# Patient Record
Sex: Male | Born: 1950 | Race: White | Hispanic: No | Marital: Married | Smoking: Never smoker
Health system: Southern US, Community
[De-identification: ages and names within clinical notes are randomized; demographics above are authoritative.]

## PROBLEM LIST (undated history)

## (undated) DIAGNOSIS — I10 Essential (primary) hypertension: Secondary | ICD-10-CM

## (undated) DIAGNOSIS — H04123 Dry eye syndrome of bilateral lacrimal glands: Secondary | ICD-10-CM

## (undated) DIAGNOSIS — G4730 Sleep apnea, unspecified: Secondary | ICD-10-CM

## (undated) DIAGNOSIS — G4733 Obstructive sleep apnea (adult) (pediatric): Secondary | ICD-10-CM

## (undated) DIAGNOSIS — K635 Polyp of colon: Secondary | ICD-10-CM

## (undated) DIAGNOSIS — H3320 Serous retinal detachment, unspecified eye: Secondary | ICD-10-CM

## (undated) DIAGNOSIS — K5790 Diverticulosis of intestine, part unspecified, without perforation or abscess without bleeding: Secondary | ICD-10-CM

## (undated) DIAGNOSIS — M5431 Sciatica, right side: Secondary | ICD-10-CM

## (undated) DIAGNOSIS — C439 Malignant melanoma of skin, unspecified: Secondary | ICD-10-CM

## (undated) DIAGNOSIS — M545 Low back pain, unspecified: Secondary | ICD-10-CM

## (undated) DIAGNOSIS — N529 Male erectile dysfunction, unspecified: Secondary | ICD-10-CM

## (undated) DIAGNOSIS — G8929 Other chronic pain: Secondary | ICD-10-CM

## (undated) DIAGNOSIS — R937 Abnormal findings on diagnostic imaging of other parts of musculoskeletal system: Secondary | ICD-10-CM

## (undated) HISTORY — DX: Diverticulosis of intestine, part unspecified, without perforation or abscess without bleeding: K57.90

## (undated) HISTORY — DX: Sleep apnea, unspecified: G47.30

## (undated) HISTORY — DX: Essential (primary) hypertension: I10

## (undated) HISTORY — DX: Polyp of colon: K63.5

## (undated) HISTORY — DX: Serous retinal detachment, unspecified eye: H33.20

## (undated) HISTORY — PX: GANGLION CYST EXCISION: SHX1691

## (undated) HISTORY — DX: Sciatica, right side: M54.31

## (undated) HISTORY — DX: Malignant melanoma of skin, unspecified: C43.9

## (undated) HISTORY — DX: Obstructive sleep apnea (adult) (pediatric): G47.33

## (undated) HISTORY — DX: Dry eye syndrome of bilateral lacrimal glands: H04.123

## (undated) MED ORDER — MEFLOQUINE 250 MG TAB
250 mg | ORAL_TABLET | ORAL | Status: AC
Start: ? — End: 2013-10-24

---
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
## 1993-11-29 HISTORY — PX: RETINAL TEAR REPAIR CRYOTHERAPY: SHX5304

## 2006-08-31 HISTORY — PX: RETINAL DETACHMENT REPAIR W/ SCLERAL BUCKLE LE: SHX2338

## 2012-04-01 ENCOUNTER — Encounter

## 2012-07-19 LAB — URINALYSIS W/ RFLX MICROSCOPIC
Bilirubin: NEGATIVE
Blood: NEGATIVE
Glucose: NEGATIVE
Ketone: NEGATIVE
Leukocyte Esterase: NEGATIVE
Nitrites: NEGATIVE
Protein: NEGATIVE
Specific Gravity: 1.005 (ref 1.005–1.030)
Urobilinogen: 0.2 mg/dL (ref 0.0–1.9)
pH (UA): 7 (ref 5.0–7.5)

## 2012-07-19 LAB — LIPID PANEL
Cholesterol, total: 179 mg/dL (ref 100–199)
HDL Cholesterol: 51 mg/dL (ref >39)
LDL, calculated: 110 mg/dL — ABNORMAL HIGH (ref 0–99)
Triglyceride: 91 mg/dL (ref 0–149)
VLDL, calculated: 18 mg/dL (ref 5–40)

## 2012-07-19 LAB — CBC WITH AUTOMATED DIFF
ABS. BASOPHILS: 0 10*3/uL (ref 0.0–0.2)
ABS. EOSINOPHILS: 0 10*3/uL (ref 0.0–0.4)
ABS. IMM. GRANS.: 0 10*3/uL (ref 0.0–0.1)
ABS. MONOCYTES: 0.5 10*3/uL (ref 0.1–0.9)
ABS. NEUTROPHILS: 6.7 10*3/uL (ref 1.4–7.0)
Abs Lymphocytes: 1.7 10*3/uL (ref 0.7–3.1)
BASOPHILS: 0 % (ref 0–3)
EOSINOPHILS: 0 % (ref 0–5)
HCT: 45.8 % (ref 37.5–51.0)
HGB: 15.8 g/dL (ref 12.6–17.7)
IMMATURE GRANULOCYTES: 0 % (ref 0–2)
Lymphocytes: 19 % (ref 14–46)
MCH: 28.7 pg (ref 26.6–33.0)
MCHC: 34.5 g/dL (ref 31.5–35.7)
MCV: 83 fL (ref 79–97)
MONOCYTES: 6 % (ref 4–12)
NEUTROPHILS: 75 % — ABNORMAL HIGH (ref 40–74)
PLATELET: 201 10*3/uL (ref 155–379)
RBC: 5.5 x10E6/uL (ref 4.14–5.80)
RDW: 13.2 % (ref 12.3–15.4)
WBC: 9 10*3/uL (ref 3.4–10.8)

## 2012-07-19 LAB — METABOLIC PANEL, BASIC
BUN/Creatinine ratio: 17 (ref 9–20)
BUN: 15 mg/dL (ref 6–24)
CO2: 27 mmol/L (ref 19–28)
Calcium: 9.4 mg/dL (ref 8.7–10.2)
Chloride: 102 mmol/L (ref 97–108)
Creatinine: 0.86 mg/dL (ref 0.76–1.27)
GFR est AA: 110 mL/min/{1.73_m2} (ref >59)
GFR est non-AA: 95 mL/min/{1.73_m2} (ref >59)
Glucose: 99 mg/dL (ref 65–99)
Potassium: 4.7 mmol/L (ref 3.5–5.2)
Sodium: 141 mmol/L (ref 134–144)

## 2012-07-19 LAB — PSA, DIAGNOSTIC (PROSTATE SPECIFIC AG): Prostate Specific Ag: 1.6 ng/mL (ref 0.0–4.0)

## 2012-07-21 NOTE — Progress Notes (Signed)
Comprehensive Visit    New pt.  Patrick Duarte is a 61 y.o. male who presents for his comprehensive visit.  Pt is new to me.  He is a Education officer, environmental.  he does exercise.  No CP or SOB.  He thinks he is UTD tetanus vaccine.  BP's have been OK at home.        Past Medical History   Diagnosis Date   ??? HTN (hypertension)    ??? Environmental allergies      Past Surgical History   Procedure Laterality Date   ??? Hx colonoscopy  2004     Polyps, per pt.     Current Outpatient Prescriptions   Medication Sig Dispense Refill   ??? azelastine (ASTELIN) 137 mcg nasal spray 1 Spray two (2) times a day. Use in each nostril as directed       ??? fluticasone (FLONASE) 50 mcg/actuation nasal spray 2 Sprays by Both Nostrils route nightly.       ??? aspirin delayed-release 81 mg tablet Take  by mouth daily.         No current facility-administered medications for this visit.     No Known Allergies  History   Substance Use Topics   ??? Smoking status: Never Smoker    ??? Smokeless tobacco: Not on file   ??? Alcohol Use: No      Family History   Problem Relation Age of Onset   ??? Cancer Father      prostate ca   ??? Cancer Brother      prostate ca       Routine maintenance:          No health maintenance topics applied.       Objective:  BP 140/90   Ht 6' (1.829 m)   Wt 182 lb (82.555 kg)   BMI 24.68 kg/m2    Physical Exam  HEENT -- Pupils round. OP clear.  Neck -- Supple. No JVD. No LAD. No thyromegaly or nodules. No carotid bruits.  Heart -- RRR. No R/M/G.  Lungs -- CTA.  Abd -- Soft. NT/ND. No masses. BS present.  Extremities -- No LE edema b/l.  Prostate -- Nl size/tone.  No nodules.          Assessment/Plan:    1. Routine general medical examination at a health care facility  LIPID PANEL, METABOLIC PANEL, BASIC, CBC WITH AUTOMATED DIFF, URINALYSIS W/ RFLX MICROSCOPIC, PROSTATE SPECIFIC AG (PSA), REFERRAL TO GASTROENTEROLOGY for a screening colonoscopy.     Routine maintenance as above.    He will cont to keep an eye on his BP's at home & let me know if  they are elevated.      Author:  Nelida Gores, MD 07/21/2012 7:15 PM

## 2012-08-23 NOTE — Telephone Encounter (Signed)
Pt called    Pt is having the beginning of a sinus inf   The only symptoms that he has now is drainage and some sore throat     [facility] called in amox 500mg  TID times 10 days     Pt told not to take med unless symptoms get worse  #[phone]   11:38

## 2012-10-18 NOTE — Telephone Encounter (Signed)
Please call in refills, pharmacy will not refill   [phone]  Azelastine 137 mcg  Flonase 50 mcg  [facility] [phone]

## 2012-10-19 NOTE — Telephone Encounter (Signed)
10/18/12   Called in rxs to pharmacy times 6 mos   4:38

## 2012-12-16 NOTE — Progress Notes (Signed)
HPI:uri with full sinuses    Physical Examination:  BP 130/90   Ht 6' (1.829 m)   Wt 182 lb (82.555 kg)   BMI 24.68 kg/m2  General:  HEENT:neg  NECK:  Lungs:  Heart:  Breasts:  Abdomen:  Rectal:  Extremities:  Neurologic:    Prior to Admission medications    Medication Sig Start Date End Date Taking? Authorizing Provider   azelastine (ASTELIN) 137 mcg nasal spray 1 Spray two (2) times a day. Use in each nostril as directed   Yes Historical Provider   fluticasone (FLONASE) 50 mcg/actuation nasal spray 2 Sprays by Both Nostrils route nightly.   Yes Historical Provider   aspirin delayed-release 81 mg tablet Take  by mouth daily.   Yes Historical Provider     No Known Allergies    RESULTS:  Results for orders placed in visit on 07/18/12   LIPID PANEL       Result Value Range    Cholesterol, total 179  100 - 199 mg/dL    Triglyceride 91  0 - 149 mg/dL    HDL Cholesterol 51  >39 mg/dL    VLDL, calculated 18  5 - 40 mg/dL    LDL, calculated 161 (*) 0 - 99 mg/dL   METABOLIC PANEL, BASIC       Result Value Range    Glucose 99  65 - 99 mg/dL    BUN 15  6 - 24 mg/dL    Creatinine 0.96  0.45 - 1.27 mg/dL    GFR est non-AA 95  >40 mL/min/1.73    GFR est AA 110  >59 mL/min/1.73    BUN/Creatinine ratio 17  9 - 20    Sodium 141  134 - 144 mmol/L    Potassium 4.7  3.5 - 5.2 mmol/L    Chloride 102  97 - 108 mmol/L    CO2 27  19 - 28 mmol/L    Calcium 9.4  8.7 - 10.2 mg/dL   CBC WITH AUTOMATED DIFF       Result Value Range    WBC 9.0  3.4 - 10.8 x10E3/uL    RBC 5.50  4.14 - 5.80 x10E6/uL    HGB 15.8  12.6 - 17.7 g/dL    HCT 98.1  19.1 - 47.8 %    MCV 83  79 - 97 fL    MCH 28.7  26.6 - 33.0 pg    MCHC 34.5  31.5 - 35.7 g/dL    RDW 29.5  62.1 - 30.8 %    PLATELET 201  155 - 379 x10E3/uL    NEUTROPHILS 75 (*) 40 - 74 %    Lymphocytes 19  14 - 46 %    MONOCYTES 6  4 - 12 %    EOSINOPHILS 0  0 - 5 %    BASOPHILS 0  0 - 3 %    ABS. NEUTROPHILS 6.7  1.4 - 7.0 x10E3/uL    Abs Lymphocytes 1.7  0.7 - 3.1 x10E3/uL    ABS. MONOCYTES 0.5  0.1 -  0.9 x10E3/uL    ABS. EOSINOPHILS 0.0  0.0 - 0.4 x10E3/uL    ABS. BASOPHILS 0.0  0.0 - 0.2 x10E3/uL    IMMATURE GRANULOCYTES 0  0 - 2 %    ABS. IMM. GRANS. 0.0  0.0 - 0.1 x10E3/uL   URINALYSIS W/ RFLX MICROSCOPIC       Result Value Range    Specific Gravity 1.005  1.005 - 1.030  pH (UA) 7.0  5.0 - 7.5    Color Yellow  Yellow    Appearance Clear  Clear    Leukocyte Esterase Negative  Negative    Protein Negative  Negative/Trace    Glucose Negative  Negative    Ketone Negative  Negative    Blood Negative  Negative    Bilirubin Negative  Negative    Urobilinogen 0.2  0.0 - 1.9 mg/dL    Nitrites Negative  Negative    Microscopic Examination Comment     PROSTATE SPECIFIC AG (PSA)       Result Value Range    Prostate Specific Ag 1.6  0.0 - 4.0 ng/mL       Assessment/Plan:    ICD-9-CM   1. Sinusitis 473.9     aaugmentin  Follow Up:    Author: Prudencio Pair, MD 10:37 AM 12/16/2012

## 2013-09-12 LAB — METABOLIC PANEL, BASIC
BUN/Creatinine ratio: 21 (ref 10–22)
BUN: 20 mg/dL (ref 8–27)
CO2: 25 mmol/L (ref 18–29)
Calcium: 9.5 mg/dL (ref 8.6–10.2)
Chloride: 99 mmol/L (ref 97–108)
Creatinine: 0.94 mg/dL (ref 0.76–1.27)
GFR est AA: 101 mL/min/{1.73_m2} (ref >59)
GFR est non-AA: 88 mL/min/{1.73_m2} (ref >59)
Glucose: 108 mg/dL — ABNORMAL HIGH (ref 65–99)
Potassium: 4.4 mmol/L (ref 3.5–5.2)
Sodium: 140 mmol/L (ref 134–144)

## 2013-09-12 LAB — CBC WITH AUTOMATED DIFF
ABS. BASOPHILS: 0 10*3/uL (ref 0.0–0.2)
ABS. EOSINOPHILS: 0.1 10*3/uL (ref 0.0–0.4)
ABS. IMM. GRANS.: 0 10*3/uL (ref 0.0–0.1)
ABS. MONOCYTES: 0.7 10*3/uL (ref 0.1–0.9)
ABS. NEUTROPHILS: 6.4 10*3/uL (ref 1.4–7.0)
Abs Lymphocytes: 2 10*3/uL (ref 0.7–3.1)
BASOPHILS: 0 %
EOSINOPHILS: 1 %
HCT: 46.7 % (ref 37.5–51.0)
HGB: 16.5 g/dL (ref 12.6–17.7)
IMMATURE GRANULOCYTES: 0 %
Lymphocytes: 22 %
MCH: 28.9 pg (ref 26.6–33.0)
MCHC: 35.3 g/dL (ref 31.5–35.7)
MCV: 82 fL (ref 79–97)
MONOCYTES: 8 %
NEUTROPHILS: 69 %
PLATELET: 241 10*3/uL (ref 150–379)
RBC: 5.7 x10E6/uL (ref 4.14–5.80)
RDW: 13.4 % (ref 12.3–15.4)
WBC: 9.2 10*3/uL (ref 3.4–10.8)

## 2013-09-12 LAB — URINALYSIS W/ RFLX MICROSCOPIC
Bilirubin: NEGATIVE
Blood: NEGATIVE
Glucose: NEGATIVE
Ketone: NEGATIVE
Leukocyte Esterase: NEGATIVE
Nitrites: NEGATIVE
Protein: NEGATIVE
Specific Gravity: 1.028 (ref 1.005–1.030)
Urobilinogen: 0.2 mg/dL (ref 0.0–1.9)
pH (UA): 6 (ref 5.0–7.5)

## 2013-09-12 LAB — LIPID PANEL
Cholesterol, total: 205 mg/dL — ABNORMAL HIGH (ref 100–199)
HDL Cholesterol: 48 mg/dL (ref >39)
LDL, calculated: 134 mg/dL — ABNORMAL HIGH (ref 0–99)
Triglyceride: 114 mg/dL (ref 0–149)
VLDL, calculated: 23 mg/dL (ref 5–40)

## 2013-09-12 LAB — PSA, DIAGNOSTIC (PROSTATE SPECIFIC AG): Prostate Specific Ag: 1.8 ng/mL (ref 0.0–4.0)

## 2013-09-14 NOTE — Progress Notes (Signed)
Comprehensive Visit    Patrick AvenaCraig A Bently is a 63 y.o. male who presents for his comprehensive visit.  Pt is doing OK.  he exercises some.  No CP or SOB.        Past Medical History   Diagnosis Date   ??? HTN (hypertension)    ??? Environmental allergies    ??? Lumbar disc disease    ??? H/O prostate biopsy around 2000     Negative     Past Surgical History   Procedure Laterality Date   ??? Hx colonoscopy  2004     Polyps, per pt.     Current Outpatient Prescriptions   Medication Sig Dispense Refill   ??? mefloquine (LARIAM) 250 mg tablet Take 250 mg by mouth every seven (7) days for 7 doses. Take with food and at least 8 ounces of water.  Start 1 week before travel & continue through 4 weeks after.  7 tablet  0   ??? azelastine (ASTELIN) 137 mcg nasal spray 1 Spray two (2) times a day. Use in each nostril as directed       ??? fluticasone (FLONASE) 50 mcg/actuation nasal spray 2 Sprays by Both Nostrils route nightly.       ??? aspirin delayed-release 81 mg tablet Take  by mouth daily.         No Known Allergies  History   Substance Use Topics   ??? Smoking status: Never Smoker    ??? Smokeless tobacco: Not on file   ??? Alcohol Use: No      Family History   Problem Relation Age of Onset   ??? Cancer Father      prostate ca   ??? Cancer Brother      prostate ca       Routine maintenance:          Health Maintenance   Topic Date Due   ??? Influenza Age 849 To Adult  03/31/2014   ??? Colonoscopy  10/18/2015   ??? Td Q 10 Yrs Age > 18  09/12/2023   ??? Zoster Vaccine Age 61>  Completed   ??? Tdap Age > 18  Completed          Objective:  BP 135/100   Ht 6' (1.829 m)   Wt 190 lb (86.183 kg)   BMI 25.76 kg/m2    Physical Exam  HEENT -- Pupils round. OP clear.  Neck -- Supple. No JVD. No LAD. No thyromegaly or nodules. No carotid bruits.  Heart -- RRR. No R/M/G.  Lungs -- CTA.  Abd -- Soft. NT/ND. No masses. BS present.  Extremities -- No LE edema b/l.  Prostate -- Slightly large.  No nodules.          Assessment/Plan:      ICD-9-CM    1. Routine general medical  examination at a health care facility V70.0 TETANUS, DIPHTHERIA TOXOIDS AND ACELLULAR PERTUSSIS VACCINE (TDAP), IN INDIVIDS. >=7, IM     CBC WITH AUTOMATED DIFF     LIPID PANEL     URINALYSIS W/ RFLX MICROSCOPIC     METABOLIC PANEL, BASIC     PROSTATE SPECIFIC AG (PSA)   2. Travel -- He is traveling to [place] soon & needs malaria prophylaxis. [id number] mefloquine (LARIAM) 250 mg tablet   3. HTN (hypertension) 401.9 We discussed BP medication.  However, he wants to try to avoid this.....  He plans to check his BP's at home & RTC 4 mos for BP  recheck.  He is to bring his machine & readings with him then.       Routine maintenance as above.        Author:  Nelida Gores, MD 09/14/2013 5:56 PM

## 2014-01-08 MED ORDER — FLUTICASONE 50 MCG/ACTUATION NASAL SPRAY, SUSP
50 mcg/actuation | NASAL | Status: DC
Start: 2014-01-08 — End: 2014-08-10

## 2014-01-08 MED ORDER — AZELASTINE 137 MCG NASAL SPRAY AEROSOL
137 mcg (0.1 %) | NASAL | Status: DC
Start: 2014-01-08 — End: 2015-02-25

## 2014-01-11 NOTE — Progress Notes (Signed)
Progress Note    Patrick Duarte is a 63 y.o. male and presents with   Chief Complaint   Patient presents with   ??? Well Male     4 mos check   ??? Blood Pressure Check     Pt says his BP's have been slightly elevated recently (130's/about 90).  No new c/o's.  He says he has been under a lot of work stress recently.        Current Outpatient Prescriptions   Medication Sig Dispense Refill   ??? fluticasone (FLONASE) 50 mcg/actuation nasal spray USE 2 SPRAYS IN EACH NOSTRIL AT BEDTIME.  16 g  3   ??? azelastine (ASTELIN) 137 mcg nasal spray USE 1 SPRAY IN EACH NOSTRIL TWICE DAILY.  1 Bottle  3   ??? aspirin delayed-release 81 mg tablet Take  by mouth daily.         No Known Allergies  Past Medical History   Diagnosis Date   ??? HTN (hypertension)    ??? Environmental allergies    ??? Lumbar disc disease    ??? H/O prostate biopsy around 2000     Negative     Past Surgical History   Procedure Laterality Date   ??? Hx colonoscopy  2004     Polyps, per pt.     Family History   Problem Relation Age of Onset   ??? Cancer Father      prostate ca   ??? Cancer Brother      prostate ca     History   Substance Use Topics   ??? Smoking status: Never Smoker    ??? Smokeless tobacco: Not on file   ??? Alcohol Use: No          Objective:  BP 135/90 manually, (148/92 by his machine)  Ht 6' (1.829 m)   Wt 196 lb (88.905 kg)   BMI 26.58 kg/m2    In NAD. Alert.  Neck -- Supple. No JVD.             Assessment/Plan:    ICD-9-CM   1. Essential hypertension 401.9   2. Environmental allergies V15.09       His machine reads a little high, but his BP is borderline.  We discussed options, and we agree with no medication at this point -- He is going to try to lose 10 lbs, which I think should help.  He will cont to keep an eye on his BP's at home, and he is to let me know if it were to go up.        Author:  Nelida GoresJ Stephen Horacio Werth, MD 01/11/2014 7:38 PM

## 2014-03-30 MED ORDER — CEFDINIR 300 MG CAP
300 mg | ORAL_CAPSULE | Freq: Every day | ORAL | Status: AC
Start: 2014-03-30 — End: 2014-04-09

## 2014-04-03 NOTE — Progress Notes (Signed)
Progress Note    Patrick Duarte is a 64 y.o. male and presents with   Chief Complaint   Patient presents with   ??? Sinus Infection   ??? Sore Throat     Pt c/o about 2 weeks of some nasal congestion.  Some cough prod of some phlegm.  No SOB.        Current Outpatient Prescriptions   Medication Sig Dispense Refill   ??? cefdinir (OMNICEF) 300 mg capsule Take 2 Caps by mouth daily for 10 days. 20 Cap 0   ??? fluticasone (FLONASE) 50 mcg/actuation nasal spray USE 2 SPRAYS IN EACH NOSTRIL AT BEDTIME. 16 g 3   ??? azelastine (ASTELIN) 137 mcg nasal spray USE 1 SPRAY IN EACH NOSTRIL TWICE DAILY. 1 Bottle 3   ??? aspirin delayed-release 81 mg tablet Take  by mouth daily.       No Known Allergies  Past Medical History   Diagnosis Date   ??? HTN (hypertension)    ??? Environmental allergies    ??? Lumbar disc disease    ??? H/O prostate biopsy around 2000     Negative     Past Surgical History   Procedure Laterality Date   ??? Hx colonoscopy  2004     Polyps, per pt.     Family History   Problem Relation Age of Onset   ??? Cancer Father      prostate ca   ??? Cancer Brother      prostate ca     History   Substance Use Topics   ??? Smoking status: Never Smoker    ??? Smokeless tobacco: Not on file   ??? Alcohol Use: No          Objective:  Ht 6' (1.829 m)   Wt 191 lb (86.637 kg)   BMI 25.90 kg/m2    In NAD. Alert.  HEENT -- TM's WNL.  OP clear.  No sinus tenderness.  Neck -- Supple. No JVD.  Heart -- RRR.  Lungs -- CTA.             Assessment/Plan:    ICD-9-CM    1. URI (upper respiratory infection) 465.9 cefdinir (OMNICEF) 300 mg capsule   2. Acute bronchitis, unspecified organism 466.0 cefdinir (OMNICEF) 300 mg capsule       he is to let me know if no improvement, etc.      Author:  Nelida Gores, MD 04/03/2014 7:32 PM

## 2014-08-10 MED ORDER — FLUTICASONE 50 MCG/ACTUATION NASAL SPRAY, SUSP
50 mcg/actuation | NASAL | Status: DC
Start: 2014-08-10 — End: 2018-09-05

## 2015-01-18 ENCOUNTER — Ambulatory Visit: Admit: 2015-01-18 | Payer: PRIVATE HEALTH INSURANCE | Attending: Internal Medicine | Primary: Internal Medicine

## 2015-01-18 DIAGNOSIS — Z0000 Encounter for general adult medical examination without abnormal findings: Secondary | ICD-10-CM

## 2015-01-18 LAB — AMB POC URINALYSIS DIP STICK AUTO W/O MICRO
Bilirubin (UA POC): NEGATIVE
Glucose (UA POC): NEGATIVE
Ketones (UA POC): NEGATIVE
Nitrites (UA POC): NEGATIVE
Protein (UA POC): NEGATIVE mg/dL
Specific gravity (UA POC): 1.005 (ref 1.001–1.035)
Urobilinogen (UA POC): NORMAL (ref 0.2–1)
pH (UA POC): 6 (ref 4.6–8.0)

## 2015-01-18 LAB — AMB POC COMPLETE CBC,AUTOMATED ENTER
ABS. GRANS (POC): 9.2 10*3/uL — AB (ref 1.4–6.5)
ABS. LYMPHS (POC): 1.6 10*3/uL (ref 1.2–3.4)
ABS. MONOS (POC): 0.3 10*3/uL (ref 0.1–0.6)
GRANULOCYTES (POC): 83 % — AB (ref 42.2–75.2)
HCT (POC): 48.5 % (ref 35.0–60.0)
HGB (POC): 15.5 g/dL (ref 11–18)
LYMPHOCYTES (POC): 14 % — AB (ref 20.5–51.1)
MCH (POC): 27.4 pg (ref 27.0–31.0)
MCHC (POC): 31.9 g/dL — AB (ref 33.0–37.0)
MCV (POC): 86 fL (ref 80.0–99.9)
MONOCYTES (POC): 3 % (ref 1.7–9.3)
MPV (POC): 7.6 fL — AB (ref 7.8–11)
PLATELET (POC): 185 10*3/uL (ref 150–450)
RBC (POC): 5.64 M/uL (ref 4.00–6.00)
RDW (POC): 13.6 % (ref 11.6–13.7)
WBC (POC): 11.1 10*3/uL — AB (ref 4.5–10.5)

## 2015-01-19 LAB — METABOLIC PANEL, BASIC
BUN/Creatinine ratio: 16 (ref 10–22)
BUN: 13 mg/dL (ref 8–27)
CO2: 27 mmol/L (ref 18–29)
Calcium: 9.3 mg/dL (ref 8.6–10.2)
Chloride: 91 mmol/L — ABNORMAL LOW (ref 97–108)
Creatinine: 0.83 mg/dL (ref 0.76–1.27)
GFR est AA: 109 mL/min/{1.73_m2} (ref >59)
GFR est non-AA: 94 mL/min/{1.73_m2} (ref >59)
Glucose: 109 mg/dL — ABNORMAL HIGH (ref 65–99)
Potassium: 3.5 mmol/L (ref 3.5–5.2)
Sodium: 135 mmol/L (ref 134–144)

## 2015-01-19 LAB — LIPID PANEL
Cholesterol, total: 125 mg/dL (ref 100–199)
HDL Cholesterol: 47 mg/dL (ref >39)
LDL, calculated: 54 mg/dL (ref 0–99)
Triglyceride: 122 mg/dL (ref 0–149)
VLDL, calculated: 24 mg/dL (ref 5–40)

## 2015-01-19 LAB — PSA, DIAGNOSTIC (PROSTATE SPECIFIC AG): Prostate Specific Ag: 0.6 ng/mL (ref 0.0–4.0)

## 2015-01-21 NOTE — Progress Notes (Signed)
Comprehensive Visit    Patrick AvenaCraig A Duarte is a 64 y.o. male who presents for his comprehensive visit.  Pt is doing OK.  he does exercise.  No CP or SOB.  Denies BRBPR.  BP's have been OK at home.        Past Medical History   Diagnosis Date   ??? HTN (hypertension)    ??? Environmental allergies    ??? Lumbar disc disease    ??? H/O prostate biopsy around 2000     Negative     Past Surgical History   Procedure Laterality Date   ??? Hx colonoscopy  2004     Polyps, per pt.     Current Outpatient Prescriptions   Medication Sig Dispense Refill   ??? fexofenadine (ALLEGRA) 180 mg tablet Take 180 mg by mouth daily.     ??? fluticasone (FLONASE) 50 mcg/actuation nasal spray USE 2 SPRAYS IN EACH NOSTRIL AT BEDTIME. 16 g 11   ??? azelastine (ASTELIN) 137 mcg nasal spray USE 1 SPRAY IN EACH NOSTRIL TWICE DAILY. 1 Bottle 3   ??? aspirin delayed-release 81 mg tablet Take  by mouth daily.       No Known Allergies  History   Substance Use Topics   ??? Smoking status: Never Smoker    ??? Smokeless tobacco: Not on file   ??? Alcohol Use: No      Family History   Problem Relation Age of Onset   ??? Cancer Father      prostate ca   ??? Cancer Brother      prostate ca       Routine maintenance:          Health Maintenance   Topic Date Due   ??? Hepatitis C Screening  02/05/1951   ??? INFLUENZA AGE 68 TO ADULT  04/01/2015   ??? COLONOSCOPY  10/18/2015   ??? DTaP/Tdap/Td series (2 - Td) 09/12/2023   ??? ZOSTER VACCINE AGE 42>  Completed          Objective:  BP 135/80 mmHg   Ht 6' (1.829 m)    Physical Exam  HEENT -- Pupils round. OP clear.  Neck -- Supple. No JVD. No LAD. No thyromegaly or nodules. No carotid bruits.  Heart -- RRR. No R/M/G.  Lungs -- CTA.  Abd -- Soft. NT/ND. No masses. BS present.  Extremities -- No LE edema b/l.  Prostate -- Large with nl tone.  No nodules.          Assessment/Plan:      ICD-10-CM ICD-9-CM    1. Routine general medical examination at a health care facility Z00.00 V70.0 AMB POC URINALYSIS DIP STICK AUTO W/O MICRO       AMB POC COMPLETE CBC,AUTOMATED ENTER      PROSTATE SPECIFIC AG (PSA)      LIPID PANEL      METABOLIC PANEL, BASIC       Routine maintenance as above.    He is given the name of [facility] for a skin cancer screening.      Author:  Nelida GoresJ STEPHEN Rudolph Daoust, MD 01/21/2015 10:08 PM

## 2015-02-26 MED ORDER — AZELASTINE 137 MCG NASAL SPRAY AEROSOL
137 mcg (0.1 %) | NASAL | Status: DC
Start: 2015-02-26 — End: 2018-09-05

## 2016-01-20 ENCOUNTER — Ambulatory Visit: Admit: 2016-01-20 | Payer: PRIVATE HEALTH INSURANCE | Attending: Internal Medicine | Primary: Internal Medicine

## 2016-01-20 DIAGNOSIS — Z0000 Encounter for general adult medical examination without abnormal findings: Secondary | ICD-10-CM

## 2016-01-20 LAB — AMB POC URINALYSIS DIP STICK AUTO W/O MICRO
Bilirubin (UA POC): NEGATIVE
Glucose (UA POC): NEGATIVE
Ketones (UA POC): NEGATIVE
Leukocyte esterase (UA POC): NEGATIVE
Nitrites (UA POC): NEGATIVE
Protein (UA POC): NEGATIVE mg/dL
Specific gravity (UA POC): 1.025 (ref 1.001–1.035)
Urobilinogen (UA POC): 0.2 (ref 0.2–1)
pH (UA POC): 6.5 (ref 4.6–8.0)

## 2016-01-20 LAB — AMB POC COMPLETE CBC,AUTOMATED ENTER
ABS. GRANS (POC): 6.5 10*3/uL (ref 1.4–6.5)
ABS. LYMPHS (POC): 1.8 10*3/uL (ref 1.2–3.4)
ABS. MONOS (POC): 0.5 10*3/uL (ref 0.1–0.6)
GRANULOCYTES (POC): 73.9 % (ref 42.2–75.2)
HCT (POC): 49.9 % (ref 35.0–60.0)
HGB (POC): 16 g/dL (ref 11–18)
LYMPHOCYTES (POC): 20 % — AB (ref 20.5–51.1)
MCH (POC): 27.1 pg (ref 27.0–31.0)
MCHC (POC): 32.2 g/dL — AB (ref 33.0–37.0)
MCV (POC): 84.1 fL (ref 80.0–99.9)
MONOCYTES (POC): 6.1 % (ref 1.7–9.3)
MPV (POC): 7.8 fL (ref 7.8–11)
PLATELET (POC): 213 10*3/uL (ref 150–450)
RBC (POC): 5.93 M/uL (ref 4.00–6.00)
RDW (POC): 13.2 % (ref 11.6–13.7)
WBC (POC): 8.8 10*3/uL (ref 4.5–10.5)

## 2016-01-20 NOTE — Progress Notes (Signed)
Comprehensive Visit    Patrick AvenaCraig A Duarte is a 65 y.o. male who presents for his comprehensive visit.  Pt is doing OK.  he does exercise.  No CP or SOB.        Past Medical History:   Diagnosis Date   ??? Environmental allergies    ??? H/O prostate biopsy around 2000    Negative   ??? HTN (hypertension)    ??? Lumbar disc disease      Past Surgical History:   Procedure Laterality Date   ??? HX COLONOSCOPY  2004    Polyps, per pt.     Current Outpatient Prescriptions   Medication Sig Dispense Refill   ??? azelastine (ASTELIN) 137 mcg (0.1 %) nasal spray USE 1 SPRAY IN EACH NOSTRIL TWICE DAILY. (Patient taking differently: USE 1 SPRAY IN EACH NOSTRIL TWICE DAILY as needed.) 3 Bottle 3   ??? fexofenadine (ALLEGRA) 180 mg tablet Take 180 mg by mouth daily.     ??? fluticasone (FLONASE) 50 mcg/actuation nasal spray USE 2 SPRAYS IN EACH NOSTRIL AT BEDTIME. (Patient taking differently: USE 2 SPRAYS IN EACH NOSTRIL AT BEDTIME as needed.) 16 g 11   ??? aspirin delayed-release 81 mg tablet Take  by mouth daily.       No Known Allergies  Social History   Substance Use Topics   ??? Smoking status: Never Smoker   ??? Smokeless tobacco: Not on file   ??? Alcohol use No      Family History   Problem Relation Age of Onset   ??? Cancer Father      prostate ca   ??? Cancer Brother      prostate ca           Routine maintenance:          Health Maintenance   Topic Date Due   ??? Hepatitis C Screening  1951/03/04   ??? INFLUENZA AGE 52 TO ADULT  03/31/2016   ??? COLONOSCOPY  11/25/2018   ??? DTaP/Tdap/Td series (2 - Td) 09/12/2023   ??? ZOSTER VACCINE AGE 23>  Completed          Objective:  Visit Vitals   ??? BP 135/90   ??? Ht 6' (1.829 m)   ??? Wt 183 lb (83 kg)   ??? BMI 24.82 kg/m2       Physical Exam  HEENT -- Pupils round. OP clear.  Neck -- Supple. No JVD. No LAD. No thyromegaly or nodules. No carotid bruits.  Heart -- RRR. No R/M/G.  Lungs -- CTA.  Abd -- Soft. NT/ND. No masses. BS present.  Extremities -- No LE edema b/l.  Prostate -- Large.  No nodules.           Assessment/Plan:      ICD-10-CM ICD-9-CM    1. Routine general medical examination at a health care facility Z00.00 V70.0 AMB POC COMPLETE CBC,AUTOMATED ENTER      AMB POC URINALYSIS DIP STICK AUTO W/O MICRO      LIPID PANEL      PROSTATE SPECIFIC AG (PSA)      HEMOGLOBIN A1C WITH EAG      HEPATITIS C AB       Routine maintenance as above.    His BP is borderline.  I rec he keep an eye on his BP's at home & RTC 4 mos with his readings & his machine.      Author:  Nelida GoresJ Stephen Cordaryl Decelles, MD 01/22/2016 4:55 PM

## 2016-01-21 LAB — HEPATITIS C ANTIBODY: HCV Ab: <0.1 s/co ratio (ref 0.0–0.9)

## 2016-01-21 LAB — LIPID PANEL
Cholesterol, total: 193 mg/dL (ref 100–199)
HDL Cholesterol: 53 mg/dL (ref >39)
LDL, calculated: 123 mg/dL — ABNORMAL HIGH (ref 0–99)
Triglyceride: 86 mg/dL (ref 0–149)
VLDL, calculated: 17 mg/dL (ref 5–40)

## 2016-01-21 LAB — HEMOGLOBIN A1C WITH EAG
Estimated average glucose: 128 mg/dL
Hemoglobin A1c: 6.1 % — ABNORMAL HIGH (ref 4.8–5.6)

## 2016-01-21 LAB — HEPATITIS C AB: Hep C Virus Ab: <0.1 s/co ratio (ref 0.0–0.9)

## 2016-01-21 LAB — PSA, DIAGNOSTIC (PROSTATE SPECIFIC AG): Prostate Specific Ag: 2 ng/mL (ref 0.0–4.0)

## 2016-05-20 ENCOUNTER — Ambulatory Visit: Admit: 2016-05-20 | Payer: PRIVATE HEALTH INSURANCE | Attending: Internal Medicine | Primary: Internal Medicine

## 2016-05-20 DIAGNOSIS — E119 Type 2 diabetes mellitus without complications: Secondary | ICD-10-CM

## 2016-05-20 NOTE — Progress Notes (Signed)
Progress Note    Patrick Duarte is a 65 y.o. male and presents with   Chief Complaint   Patient presents with   ??? Well Male     4 mos check     Pt is doing OK.  He exercises, and he has lost 6 lbs.  His BP's have been in the 130's/80's at home.  He continues on medication as below.        Current Outpatient Prescriptions   Medication Sig Dispense Refill   ??? azelastine (ASTELIN) 137 mcg (0.1 %) nasal spray USE 1 SPRAY IN EACH NOSTRIL TWICE DAILY. (Patient taking differently: USE 1 SPRAY IN EACH NOSTRIL TWICE DAILY as needed.) 3 Bottle 3   ??? fexofenadine (ALLEGRA) 180 mg tablet Take 180 mg by mouth daily.     ??? fluticasone (FLONASE) 50 mcg/actuation nasal spray USE 2 SPRAYS IN EACH NOSTRIL AT BEDTIME. (Patient taking differently: USE 2 SPRAYS IN EACH NOSTRIL AT BEDTIME as needed.) 16 g 11   ??? aspirin delayed-release 81 mg tablet Take  by mouth daily.       No Known Allergies  Past Medical History:   Diagnosis Date   ??? Environmental allergies    ??? H/O prostate biopsy around 2000    Negative   ??? HTN (hypertension)    ??? Lumbar disc disease      Past Surgical History:   Procedure Laterality Date   ??? HX COLONOSCOPY  2004    Polyps, per pt.     Family History   Problem Relation Age of Onset   ??? Cancer Father      prostate ca   ??? Cancer Brother      prostate ca     Social History   Substance Use Topics   ??? Smoking status: Never Smoker   ??? Smokeless tobacco: Not on file   ??? Alcohol use No          Objective:  Visit Vitals   ??? BP 130/80   ??? Ht 6' (1.829 m)   ??? Wt 177 lb (80.3 kg)   ??? BMI 24.01 kg/m2       In NAD. Alert.  Neck -- Supple. No JVD.             Assessment/Plan:    ICD-10-CM ICD-9-CM    1. Controlled type 2 diabetes mellitus without complication, without long-term current use of insulin (HCC) E11.9 250.00 HEMOGLOBIN A1C WITH EAG   2. Essential hypertension I10 401.9 BP OK now, without medication.   3. Encounter for immunization Z23 V03.89 INFLUENZA VIRUS VACCINE, PRESERVATIVE FREE SYRINGE, 3 YRS AND OLDER        Further management pending lab results.    RTC in the spring for his wellness visit.        Author:  Nelida GoresJ Stephen Penn Grissett, MD 05/24/2016 3:24 PM

## 2016-05-21 LAB — HEMOGLOBIN A1C WITH EAG
Estimated average glucose: 117 mg/dL
Hemoglobin A1c: 5.7 % — ABNORMAL HIGH (ref 4.8–5.6)

## 2017-01-15 ENCOUNTER — Ambulatory Visit: Admit: 2017-01-15 | Payer: PRIVATE HEALTH INSURANCE | Attending: Physician Assistant | Primary: Internal Medicine

## 2017-01-15 DIAGNOSIS — J0100 Acute maxillary sinusitis, unspecified: Secondary | ICD-10-CM

## 2017-01-15 MED ORDER — CEFDINIR 300 MG CAP
300 mg | ORAL_CAPSULE | Freq: Two times a day (BID) | ORAL | 0 refills | Status: DC
Start: 2017-01-15 — End: 2017-01-20

## 2017-01-15 NOTE — Progress Notes (Signed)
Progress Note    Patrick AvenaCraig A Aubuchon is a 66 y.o. male and presents with   Chief Complaint   Patient presents with   ??? Sinus Infection     Sinus pressure x2 days with headaches and scratchy throat.   No fever, cp, sob or difficulty breathing    Current Outpatient Prescriptions   Medication Sig Dispense Refill   ??? cefdinir (OMNICEF) 300 mg capsule Take 1 Cap by mouth two (2) times a day for 7 days. 14 Cap 0   ??? azelastine (ASTELIN) 137 mcg (0.1 %) nasal spray USE 1 SPRAY IN EACH NOSTRIL TWICE DAILY. (Patient taking differently: USE 1 SPRAY IN EACH NOSTRIL TWICE DAILY as needed.) 3 Bottle 3   ??? fexofenadine (ALLEGRA) 180 mg tablet Take 180 mg by mouth daily.     ??? fluticasone (FLONASE) 50 mcg/actuation nasal spray USE 2 SPRAYS IN EACH NOSTRIL AT BEDTIME. (Patient taking differently: USE 2 SPRAYS IN EACH NOSTRIL AT BEDTIME as needed.) 16 g 11   ??? aspirin delayed-release 81 mg tablet Take  by mouth daily.       No Known Allergies  Past Medical History:   Diagnosis Date   ??? Environmental allergies    ??? H/O prostate biopsy around 2000    Negative   ??? HTN (hypertension)    ??? Lumbar disc disease      Past Surgical History:   Procedure Laterality Date   ??? HX COLONOSCOPY  2004    Polyps, per pt.     Family History   Problem Relation Age of Onset   ??? Cancer Father      prostate ca   ??? Cancer Brother      prostate ca     Social History   Substance Use Topics   ??? Smoking status: Never Smoker   ??? Smokeless tobacco: Not on file   ??? Alcohol use No          Objective:  Visit Vitals   ??? BP 158/70   ??? Temp 98.6 ??F (37 ??C)   ??? Ht 6' (1.829 m)   ??? Wt 177 lb (80.3 kg)   ??? BMI 24.01 kg/m2       In NAD. Alert.  Neck -- Supple. No LAD, R TM with redness, slight tenderness over maxillary sinus  Heart -- RRR.  Lungs -- CTA.          Assessment/Plan:    ICD-10-CM ICD-9-CM    1. Subacute maxillary sinusitis J01.00 461.0 cefdinir (OMNICEF) 300 mg capsule       To call if sx worsens        Author:  Carlesha Seiple A Sherrier-Edwards, PA 01/15/2017 11:57 AM

## 2017-01-20 ENCOUNTER — Ambulatory Visit: Admit: 2017-01-20 | Payer: PRIVATE HEALTH INSURANCE | Attending: Internal Medicine | Primary: Internal Medicine

## 2017-01-20 DIAGNOSIS — Z0000 Encounter for general adult medical examination without abnormal findings: Secondary | ICD-10-CM

## 2017-01-20 LAB — AMB POC URINALYSIS DIP STICK AUTO W/O MICRO
Bilirubin (UA POC): NEGATIVE
Glucose (UA POC): NEGATIVE
Ketones (UA POC): NEGATIVE
Leukocyte esterase (UA POC): NEGATIVE
Nitrites (UA POC): NEGATIVE
Protein (UA POC): NEGATIVE
Specific gravity (UA POC): 1.015 (ref 1.001–1.035)
Urobilinogen (UA POC): 0.2 (ref 0.2–1)
pH (UA POC): 6 (ref 4.6–8.0)

## 2017-01-20 LAB — AMB POC COMPLETE CBC,AUTOMATED ENTER
ABS. GRANS (POC): 7.4 10*3/uL — AB (ref 1.4–6.5)
ABS. LYMPHS (POC): 1.8 10*3/uL (ref 1.2–3.4)
ABS. MONOS (POC): 0.3 10*3/uL (ref 0.1–0.6)
GRANULOCYTES (POC): 78.2 % — AB (ref 42.2–75.2)
HCT (POC): 48.3 % (ref 35.0–60.0)
HGB (POC): 16.3 g/dL (ref 11–18)
LYMPHOCYTES (POC): 18.5 % — AB (ref 20.5–51.1)
MCH (POC): 28.6 pg (ref 27.0–31.0)
MCHC (POC): 33.7 g/dL (ref 33.0–37.0)
MCV (POC): 84.9 fL (ref 80.0–99.9)
MONOCYTES (POC): 3.3 % (ref 1.7–9.3)
MPV (POC): 8.1 fL (ref 7.8–11)
PLATELET (POC): 280 10*3/uL (ref 150–450)
RBC (POC): 5.69 M/uL (ref 4.00–6.00)
RDW (POC): 12.9 % (ref 11.6–13.7)
WBC (POC): 9.5 10*3/uL (ref 4.5–10.5)

## 2017-01-20 MED ORDER — CEFDINIR 300 MG CAP
300 mg | ORAL_CAPSULE | Freq: Two times a day (BID) | ORAL | 1 refills | Status: AC
Start: 2017-01-20 — End: 2017-01-30

## 2017-01-20 NOTE — Progress Notes (Signed)
Comprehensive Visit    Patrick Duarte is a 66 y.o. male who presents for his comprehensive visit.  Pt is doing OK.  he usually exercises.  No CP or SOB.  He has recovered from a recent URI.  He is retiring soon.        Past Medical History:   Diagnosis Date   ??? Environmental allergies    ??? H/O prostate biopsy around 2000    Negative   ??? HTN (hypertension)    ??? Lumbar disc disease      Past Surgical History:   Procedure Laterality Date   ??? HX COLONOSCOPY  2004    Polyps, per pt.     Current Outpatient Prescriptions   Medication Sig Dispense Refill   ??? cefdinir (OMNICEF) 300 mg capsule Take 1 Cap by mouth two (2) times a day for 10 days. 20 Cap 1   ??? azelastine (ASTELIN) 137 mcg (0.1 %) nasal spray USE 1 SPRAY IN EACH NOSTRIL TWICE DAILY. (Patient taking differently: USE 1 SPRAY IN EACH NOSTRIL TWICE DAILY as needed.) 3 Bottle 3   ??? fexofenadine (ALLEGRA) 180 mg tablet Take 180 mg by mouth daily.     ??? fluticasone (FLONASE) 50 mcg/actuation nasal spray USE 2 SPRAYS IN EACH NOSTRIL AT BEDTIME. (Patient taking differently: USE 2 SPRAYS IN EACH NOSTRIL AT BEDTIME as needed.) 16 g 11   ??? aspirin delayed-release 81 mg tablet Take  by mouth daily.       No Known Allergies  Social History   Substance Use Topics   ??? Smoking status: Never Smoker   ??? Smokeless tobacco: Never Used   ??? Alcohol use No      Family History   Problem Relation Age of Onset   ??? Cancer Father      prostate ca   ??? Cancer Brother      prostate ca           Routine maintenance:          Health Maintenance   Topic Date Due   ??? Influenza Age 55 to Adult  03/31/2017   ??? COLONOSCOPY  11/24/2020   ??? DTaP/Tdap/Td series (2 - Td) 09/12/2023   ??? Hepatitis C Screening  Completed   ??? ZOSTER VACCINE AGE 5>  Completed          Objective:  Visit Vitals   ??? BP 135/85   ??? Ht 6' (1.829 m)   ??? Wt 183 lb (83 kg)   ??? BMI 24.82 kg/m2       Physical Exam  HEENT -- Pupils round. OP clear.  Neck -- Supple. No JVD. No LAD. No thyromegaly or nodules. No carotid bruits.   Heart -- RRR. No R/M/G.  Lungs -- CTA.  Abd -- Soft. NT/ND. No masses. BS present.  Extremities -- No LE edema b/l.  Prostate -- Nl size/tone.  No nodules.          Assessment/Plan:      ICD-10-CM ICD-9-CM    1. Routine general medical examination at a health care facility Z00.00 V70.0 AMB POC URINALYSIS DIP STICK AUTO W/O MICRO      AMB POC COMPLETE CBC,AUTOMATED ENTER      METABOLIC PANEL, BASIC      PSA, DIAGNOSTIC (PROSTATE SPECIFIC AG)      LIPID PANEL   2. Subacute maxillary sinusitis -- He gets these occasionally, and he travels a lot.... He wants something to take when needed. J01.00 461.0 cefdinir (  OMNICEF) 300 mg capsule       Routine maintenance as above.        Author:  Nelida GoresJ Stephen Xavion Muscat, MD 01/25/2017 1:32 PM

## 2017-01-21 LAB — METABOLIC PANEL, BASIC
BUN/Creatinine ratio: 19 (ref 10–24)
BUN: 17 mg/dL (ref 8–27)
CO2: 26 mmol/L (ref 18–29)
Calcium: 9.3 mg/dL (ref 8.6–10.2)
Chloride: 99 mmol/L (ref 96–106)
Creatinine: 0.91 mg/dL (ref 0.76–1.27)
GFR est AA: 103 mL/min/{1.73_m2} (ref >59)
GFR est non-AA: 89 mL/min/{1.73_m2} (ref >59)
Glucose: 94 mg/dL (ref 65–99)
Potassium: 4.7 mmol/L (ref 3.5–5.2)
Sodium: 140 mmol/L (ref 134–144)

## 2017-01-21 LAB — LIPID PANEL
Cholesterol, total: 176 mg/dL (ref 100–199)
HDL Cholesterol: 41 mg/dL (ref >39)
LDL, calculated: 114 mg/dL — ABNORMAL HIGH (ref 0–99)
Triglyceride: 105 mg/dL (ref 0–149)
VLDL, calculated: 21 mg/dL (ref 5–40)

## 2017-01-21 LAB — PSA, DIAGNOSTIC (PROSTATE SPECIFIC AG): Prostate Specific Ag: 3.5 ng/mL (ref 0.0–4.0)

## 2017-07-07 ENCOUNTER — Ambulatory Visit: Admit: 2017-07-07 | Payer: PRIVATE HEALTH INSURANCE | Attending: Internal Medicine | Primary: Internal Medicine

## 2017-07-07 DIAGNOSIS — R972 Elevated prostate specific antigen [PSA]: Secondary | ICD-10-CM

## 2017-07-07 NOTE — Progress Notes (Signed)
Progress Note    Patrick AvenaCraig A Monreal is a 66 y.o. male and presents with   Chief Complaint   Patient presents with   ??? Labs     PSA check     Pt is here to recheck his PSA, which while normal, had trended up some.    Unrelated, he has developed an external hemorrhoid over the past couple of weeks.  Fortunately, pain not too much of an issue.        Current Outpatient Medications   Medication Sig Dispense Refill   ??? azelastine (ASTELIN) 137 mcg (0.1 %) nasal spray USE 1 SPRAY IN EACH NOSTRIL TWICE DAILY. (Patient taking differently: USE 1 SPRAY IN EACH NOSTRIL TWICE DAILY as needed.) 3 Bottle 3   ??? fexofenadine (ALLEGRA) 180 mg tablet Take 180 mg by mouth daily.     ??? fluticasone (FLONASE) 50 mcg/actuation nasal spray USE 2 SPRAYS IN EACH NOSTRIL AT BEDTIME. (Patient taking differently: USE 2 SPRAYS IN EACH NOSTRIL AT BEDTIME as needed.) 16 g 11   ??? aspirin delayed-release 81 mg tablet Take  by mouth daily.       No Known Allergies  Past Medical History:   Diagnosis Date   ??? Environmental allergies    ??? H/O prostate biopsy around 2000    Negative   ??? HTN (hypertension)    ??? Lumbar disc disease      Past Surgical History:   Procedure Laterality Date   ??? HX COLONOSCOPY  2004    Polyps, per pt.     Family History   Problem Relation Age of Onset   ??? Cancer Father         prostate ca   ??? Cancer Brother         prostate ca     Social History     Tobacco Use   ??? Smoking status: Never Smoker   ??? Smokeless tobacco: Never Used   Substance Use Topics   ??? Alcohol use: No          Objective:  Visit Vitals  Ht 6' (1.829 m)   Wt 181 lb (82.1 kg)   BMI 24.55 kg/m??       In NAD. Alert.  Neck -- Supple. No JVD.  There is an approximately 1 cm sized thrombosed external hemorrhoid.           Assessment/Plan:    ICD-10-CM ICD-9-CM    1. Elevated PSA R97.20 790.93 PSA, DIAGNOSTIC (PROSTATE SPECIFIC AG)      COLLECTION VENOUS BLOOD,VENIPUNCTURE   2. External hemorrhoid, thrombosed K64.5 455.4 I recommended time, and  bowel regimen to prevent constipation, straining, etc.       Further management pending lab results.      Author:  Nelida GoresJ Stephen Reem Fleury, MD 07/07/2017 6:24 PM

## 2017-07-08 LAB — PSA, DIAGNOSTIC (PROSTATE SPECIFIC AG): Prostate Specific Ag: 2.2 ng/mL (ref 0.0–4.0)

## 2017-08-31 HISTORY — PX: MELANOMA EXCISION: SHX5266

## 2017-10-22 ENCOUNTER — Ambulatory Visit: Admit: 2017-10-22 | Discharge: 2017-10-22 | Attending: Internal Medicine | Primary: Internal Medicine

## 2017-10-22 DIAGNOSIS — J019 Acute sinusitis, unspecified: Secondary | ICD-10-CM

## 2017-10-22 MED ORDER — CEFUROXIME AXETIL 500 MG TAB
500 mg | ORAL_TABLET | Freq: Two times a day (BID) | ORAL | 0 refills | Status: AC
Start: 2017-10-22 — End: 2017-11-01

## 2017-10-22 NOTE — Progress Notes (Signed)
Progress Note    Patrick Duarte is a 67 y.o. male and presents with   Chief Complaint   Patient presents with   ??? Sinus Infection     ?     Pt c/o about 1 week of URI sx's.  Now has pain in R sinus.  No f/c.  No other new c/o's.        Current Outpatient Medications   Medication Sig Dispense Refill   ??? cefUROXime (CEFTIN) 500 mg tablet Take 1 Tab by mouth two (2) times a day for 10 days. 20 Tab 0   ??? azelastine (ASTELIN) 137 mcg (0.1 %) nasal spray USE 1 SPRAY IN EACH NOSTRIL TWICE DAILY. (Patient taking differently: USE 1 SPRAY IN EACH NOSTRIL TWICE DAILY as needed.) 3 Bottle 3   ??? fexofenadine (ALLEGRA) 180 mg tablet Take 180 mg by mouth daily.     ??? fluticasone (FLONASE) 50 mcg/actuation nasal spray USE 2 SPRAYS IN EACH NOSTRIL AT BEDTIME. (Patient taking differently: USE 2 SPRAYS IN EACH NOSTRIL AT BEDTIME as needed.) 16 g 11   ??? aspirin delayed-release 81 mg tablet Take  by mouth daily.       No Known Allergies  Past Medical History:   Diagnosis Date   ??? Environmental allergies    ??? H/O prostate biopsy around 2000    Negative   ??? HTN (hypertension)    ??? Lumbar disc disease      Past Surgical History:   Procedure Laterality Date   ??? HX COLONOSCOPY  2004    Polyps, per pt.     Family History   Problem Relation Age of Onset   ??? Cancer Father         prostate ca   ??? Cancer Brother         prostate ca     Social History     Tobacco Use   ??? Smoking status: Never Smoker   ??? Smokeless tobacco: Never Used   Substance Use Topics   ??? Alcohol use: No          Objective:  Visit Vitals  BP 135/88 Comment: Been taking Sudafed.   Ht 6' (1.829 m)   Wt 190 lb (86.2 kg)   BMI 25.77 kg/m??       In NAD. Alert.  HEENT -- OP clear.  TM's WNL.  There is some R maxillary & R frontal sinus tenderness.  Neck -- Supple. No JVD.  Heart -- RRR.  Lungs -- CTA.             Assessment/Plan:    ICD-10-CM ICD-9-CM    1. Acute sinusitis, recurrence not specified, unspecified location J01.90 461.9 cefUROXime (CEFTIN) 500 mg tablet        he is to let me know if no improvement, etc.        Author:  Nelida GoresJ Stephen Azariya Freeman, MD 10/24/2017 12:33 PM

## 2017-11-10 NOTE — Telephone Encounter (Signed)
Patient needs a call back its a follow up.    Pt ph# [phone]

## 2017-11-10 NOTE — Telephone Encounter (Signed)
11/10/17    Pt [place] inf [place]    Pt wants rx for [employer]ftin       Pharmacy [phone]

## 2017-11-10 NOTE — Telephone Encounter (Signed)
11/10/17  Called in to the pharmacy [phone] for Ceftin 500mg  1 PO BID times 10 days  #20 with 0 ref   9:50        Pt told rx was called in   9:51

## 2018-02-01 ENCOUNTER — Ambulatory Visit: Attending: Internal Medicine

## 2018-02-01 ENCOUNTER — Ambulatory Visit: Admit: 2018-02-01 | Attending: Internal Medicine | Primary: Internal Medicine

## 2018-02-01 DIAGNOSIS — I10 Essential (primary) hypertension: Secondary | ICD-10-CM

## 2018-02-01 LAB — AMB POC LIPID PROFILE
Cholesterol (POC): 172 mg/dL (ref 100–199)
Cholesterol, POC: 172 mg/dL (ref 100–199)
HDL Cholesterol (POC): 54 mg/dL (ref 35–150)
HDL Cholesterol, POC: 54 mg/dL (ref 35–150)
Non-HDL Goal (POC): 118
Non-HDL Goal, POC: 118 NA
TChol/HDL Ratio (POC): 3.2 CALC (ref 0.0–5.0)
TChol/HDL Ratio (POC): 3.2 CALC (ref 0.0–5.0)
Triglycerides (POC): <45 mg/dL (ref 0–150)
Triglycerides, POC: <45 mg/dL (ref 0–150)

## 2018-02-01 LAB — AMB POC URINALYSIS DIP STICK AUTO W/O MICRO
Bilirubin (UA POC): NEGATIVE
Bilirubin, Urine, POC: NEGATIVE
Glucose (UA POC): NEGATIVE
Glucose, Urine, POC: NEGATIVE
Ketones (UA POC): NEGATIVE
Ketones, Urine, POC: NEGATIVE
Nitrite, Urine, POC: NEGATIVE
Nitrites (UA POC): NEGATIVE
Protein (UA POC): NEGATIVE
Protein, Urine, POC: NEGATIVE
Specific Gravity, Urine, POC: 1.015 NA (ref 1.001–1.035)
Specific gravity (UA POC): 1.015 (ref 1.001–1.035)
Urobilinogen (UA POC): 0.2 (ref 0.2–1)
Urobilinogen, POC: 0.2 (ref 0.2–1)
pH (UA POC): 7 (ref 4.6–8.0)
pH, Urine, POC: 7 NA (ref 4.6–8.0)

## 2018-02-01 LAB — AMB POC COMPLETE CBC,AUTOMATED ENTER
ABS. GRANS (POC): 5.5 10*3/uL (ref 1.4–6.5)
ABS. LYMPHS (POC): 1.4 10*3/uL (ref 1.2–3.4)
ABS. MONOS (POC): 0.2 10*3/uL (ref 0.1–0.6)
GRANULOCYTES (POC): 77.5 % — AB (ref 42.2–75.2)
Granulocytes %, POC: 77.5 % — AB (ref 42.2–75.2)
Granulocytes Abs: 5.5 10*3/uL (ref 1.4–6.5)
HCT (POC): 46.4 % (ref 35.0–60.0)
HGB (POC): 15.5 g/dL (ref 11–18)
Hematocrit, POC: 46.4 % (ref 35.0–60.0)
Hemoglobin, POC: 15.5 g/dL (ref 11–18)
LYMPHOCYTES (POC): 19.7 % — AB (ref 20.5–51.1)
Lymphocyte %: 19.7 % — AB (ref 20.5–51.1)
Lymphs Abs: 1.4 10*3/uL (ref 1.2–3.4)
MCH (POC): 28.3 pg (ref 27.0–31.0)
MCH: 28.3 pg (ref 27.0–31.0)
MCHC (POC): 33.3 g/dL (ref 33.0–37.0)
MCHC: 33.3 g/dL (ref 33.0–37.0)
MCV (POC): 85.1 fL (ref 80.0–99.9)
MCV: 85.1 fL (ref 80.0–99.9)
MONOCYTES (POC): 2.8 % (ref 1.7–9.3)
MPV (POC): 7.5 fL — AB (ref 7.8–11)
MPV POC: 7.5 fL — AB (ref 7.8–11)
Monocyte %: 2.8 % (ref 1.7–9.3)
Monocyte Absolute, POC: 0.2 10*3/uL (ref 0.1–0.6)
PLATELET (POC): 194 10*3/uL (ref 150–450)
Platelet Count, POC: 194 10*3/uL (ref 150–450)
RBC (POC): 5.46 M/uL (ref 4.00–6.00)
RBC, POC: 5.46 M/uL (ref 4.00–6.00)
RDW (POC): 13.7 % (ref 11.6–13.7)
RDW, POC: 13.7 % (ref 11.6–13.7)
WBC (POC): 7.1 10*3/uL (ref 4.5–10.5)
WBC, POC: 7.1 10*3/uL (ref 4.5–10.5)

## 2018-02-01 LAB — AMB POC HEMOGLOBIN A1C
Hemoglobin A1C, POC: 5.6 % (ref 4.8–5.6)
Hemoglobin A1c (POC): 5.6 % (ref 4.8–5.6)

## 2018-02-01 MED ORDER — VARICELLA-ZOSTER GLYCOE VACC-AS01B ADJ(PF) 50 MCG/0.5 ML IM SUSPENSION
50 mcg/0.5 mL | Freq: Once | INTRAMUSCULAR | 1 refills | Status: AC
Start: 2018-02-01 — End: 2018-02-01

## 2018-02-01 MED ORDER — LISINOPRIL 10 MG TAB
10 mg | ORAL_TABLET | Freq: Every day | ORAL | 3 refills | Status: DC
Start: 2018-02-01 — End: 2019-01-09

## 2018-02-01 NOTE — Progress Notes (Signed)
This is a Catering manager to [employer]"  Initial Preventive Physical Examination (IPPE) providing Personalized Prevention Plan Services (Performed in the first 12 months of enrollment)    I have reviewed the patient's medical history in detail and updated the computerized patient record.     History     Past Medical History:   Diagnosis Date   ??? Environmental allergies    ??? H/O prostate biopsy around 2000    Negative   ??? HTN (hypertension)    ??? Lumbar disc disease       Past Surgical History:   Procedure Laterality Date   ??? HX COLONOSCOPY  2004    Polyps, per pt.     Current Outpatient Medications   Medication Sig Dispense Refill   ??? azelastine (ASTELIN) 137 mcg (0.1 %) nasal spray USE 1 SPRAY IN EACH NOSTRIL TWICE DAILY. (Patient taking differently: USE 1 SPRAY IN EACH NOSTRIL TWICE DAILY as needed.) 3 Bottle 3   ??? fexofenadine (ALLEGRA) 180 mg tablet Take 180 mg by mouth daily.     ??? fluticasone (FLONASE) 50 mcg/actuation nasal spray USE 2 SPRAYS IN EACH NOSTRIL AT BEDTIME. (Patient taking differently: USE 2 SPRAYS IN EACH NOSTRIL AT BEDTIME as needed.) 16 g 11   ??? aspirin delayed-release 81 mg tablet Take  by mouth daily.       No Known Allergies  Family History   Problem Relation Age of Onset   ??? Cancer Father         prostate ca   ??? Cancer Brother         prostate ca     Social History     Tobacco Use   ??? Smoking status: Never Smoker   ??? Smokeless tobacco: Never Used   Substance Use Topics   ??? Alcohol use: No     Diet, Lifestyle: No special diet    Exercise level: extremely active    Depression Risk Screen     3 most recent PHQ Screens 02/01/2018   Little interest or pleasure in doing things Not at all   Feeling down, depressed, irritable, or hopeless Not at all   Total Score PHQ 2 0     Alcohol Risk Screen   You do not drink alcohol or very rarely.    Functional Ability and Level of Safety   Hearing Loss  Hearing is good.     Vision Screening  Vision is good.  No exam data present      Activities of Daily Living   The home contains: handrails and grab bars  Patient does total self care    Fall Risk Screen  Fall Risk Assessment, last 12 mths 02/01/2018   Able to walk? Yes   Fall in past 12 months? No       Abuse Screen  Patient is not abused    Screening EKG   EKG order placed: No    Patient Care Team   Patient Care Team:  Nelida Gores, MD as PCP - General (Internal Medicine)     End of Life Planning   Advanced care planning directives were discussed with the patient and /or family/caregiver.     Assessment/Plan   Education and counseling provided:  Are appropriate based on today's review and evaluation        Health Maintenance Due   Topic Date Due   ??? Shingrix Vaccine Age 41> (1 of 2) 12/28/2002   ??? GLAUCOMA SCREENING Q2Y  12/27/2017   ???  Pneumococcal 65+ years (1 of 2 - PCV13) 12/27/2017

## 2018-02-01 NOTE — Progress Notes (Signed)
Comprehensive Visit    AXZEL ROCKHILL is a 68 y.o. male who presents for his comprehensive visit.  Pt is doing OK.  He is retired now.  he does exercise.  No CP or SOB.  His BP's have been up.  He wonders is he has OSA -- ?mild fatigue?          Past Medical History:   Diagnosis Date   ??? Environmental allergies    ??? H/O prostate biopsy around 2000    Negative   ??? HTN (hypertension)    ??? Lumbar disc disease      Past Surgical History:   Procedure Laterality Date   ??? HX COLONOSCOPY  2004    Polyps, per pt.     Current Outpatient Medications   Medication Sig Dispense Refill   ??? lisinopril (PRINIVIL, ZESTRIL) 10 mg tablet Take 1 Tab by mouth daily. 90 Tab 3   ??? azelastine (ASTELIN) 137 mcg (0.1 %) nasal spray USE 1 SPRAY IN EACH NOSTRIL TWICE DAILY. (Patient taking differently: USE 1 SPRAY IN EACH NOSTRIL TWICE DAILY as needed.) 3 Bottle 3   ??? fexofenadine (ALLEGRA) 180 mg tablet Take 180 mg by mouth daily.     ??? fluticasone (FLONASE) 50 mcg/actuation nasal spray USE 2 SPRAYS IN EACH NOSTRIL AT BEDTIME. (Patient taking differently: USE 2 SPRAYS IN EACH NOSTRIL AT BEDTIME as needed.) 16 g 11     No Known Allergies  Social History     Tobacco Use   ??? Smoking status: Never Smoker   ??? Smokeless tobacco: Never Used   Substance Use Topics   ??? Alcohol use: No      Family History   Problem Relation Age of Onset   ??? Cancer Father         prostate ca   ??? Cancer Brother         prostate ca       Fall risk evaluation done.  Depression screen done.      Routine maintenance:          Health Maintenance   Topic Date Due   ??? Shingrix Vaccine Age 31> (1 of 2) 12/28/2002   ??? GLAUCOMA SCREENING Q2Y  12/27/2017   ??? Influenza Age 37 to Adult  03/31/2018   ??? MEDICARE YEARLY EXAM  02/02/2019   ??? Pneumococcal 65+ years (2 of 2 - PCV13) 02/02/2019   ??? COLONOSCOPY  11/24/2020   ??? DTaP/Tdap/Td series (2 - Td) 09/12/2023   ??? Hepatitis C Screening  Completed          Objective:  Visit Vitals  Ht 6' (1.829 m)   Wt 180 lb (81.6 kg)    BMI 24.41 kg/m??       Physical Exam  HEENT -- Pupils round. OP clear.  Neck -- Supple. No JVD. No LAD. No thyromegaly or nodules. No carotid bruits.  Heart -- RRR. No R/M/G.  Lungs -- CTA.  Abd -- Soft. NT/ND. No masses. BS present.  Extremities -- No LE edema b/l.  Prostate -- Slightly enlarged.  No nodules.          Assessment/Plan:      ICD-10-CM ICD-9-CM    1. Essential hypertension -- Needs better control. I10 401.9 AMB POC LIPID PROFILE      AMB POC COMPLETE CBC,AUTOMATED ENTER      AMB POC URINALYSIS DIP STICK AUTO W/O MICRO      Start lisinopril (PRINIVIL, ZESTRIL) 10 mg tablet every day.  2. Welcome to Medicare preventive visit Z00.00 V70.0    3. Environmental allergies Z91.09 V15.09 On medication as above.   4. Elevated glucose R73.09 790.29 AMB POC HEMOGLOBIN A1C   5. Special screening for malignant neoplasm of prostate Z12.5 V76.44 PSA SCREENING (SCREENING)   6. Encounter for immunization Z23 V03.89 PNEUMOCOCCAL POLYSACCHARIDE VACCINE, 23-VALENT, ADULT OR IMMUNOSUPPRESSED PT DOSE,   7. Encounter for long-term (current) use of other medications Z79.899 V58.69 METABOLIC PANEL, BASIC   8. Need for shingles vaccine Z23 V04.89 varicella-zoster recombinant, PF, (SHINGRIX, PF,) 50 mcg/0.5 mL susr injection       Routine maintenance as above.    Further management pending lab results.      Author:  Nelida GoresJ Stephen Urbano Milhouse, MD 02/03/2018 9:12 AM

## 2018-02-01 NOTE — Patient Instructions (Signed)
Medicare Wellness Visit, Male    The best way to live healthy is to have a lifestyle where you eat a well-balanced diet, exercise regularly, limit alcohol use, and quit all forms of tobacco/nicotine, if applicable.   Regular preventive services are another way to keep healthy. Preventive services (vaccines, screening tests, monitoring & exams) can help personalize your care plan, which helps you manage your own care. Screening tests can find health problems at the earliest stages, when they are easiest to treat.   [facility] follows the current, evidence-based guidelines published by the [place] [employer]ventive Services Task Force (USPSTF) when recommending preventive services for our patients. Because we follow these guidelines, sometimes recommendations change over time as research supports it. (For example, a prostate screening blood test is no longer routinely recommended for men with no symptoms.)  Of course, you and your doctor may decide to screen more often for some diseases, based on your risk and co-morbidities (chronic disease you are already diagnosed with).   Preventive services for you include:  - Medicare offers their members a free annual wellness visit, which is time for you and your primary care provider to discuss and plan for your preventive service needs. Take advantage of this benefit every year!  -All adults over age 65 should receive the recommended pneumonia vaccines. Current USPSTF guidelines recommend a series of two vaccines for the best pneumonia protection.   -All adults should have a flu vaccine yearly and an ECG. All adults age 10960 and older should receive a shingles vaccine once in their lifetime.    -All adults age 65-70 who are overweight should have a diabetes screening test once every three years.   -Other screening tests & preventive services for persons with diabetes include: an eye exam to screen for diabetic retinopathy, a kidney function  test, a foot exam, and stricter control over your cholesterol.   -Cardiovascular screening for adults with routine risk involves an electrocardiogram (ECG) at intervals determined by the provider.   -Colorectal cancer screening should be done for adults age 65-75 with no increased risk factors for colorectal cancer.  There are a number of acceptable methods of screening for this type of cancer. Each test has its own benefits and drawbacks. Discuss with your provider what is most appropriate for you during your annual wellness visit. The different tests include: colonoscopy (considered the best screening method), a fecal occult blood test, a fecal DNA test, and sigmoidoscopy.  -All adults born between 1945 and 1965 should be screened once for Hepatitis C.  -An Abdominal Aortic Aneurysm (AAA) Screening is recommended for men age 865-75 who has ever smoked in their lifetime.     Here is a list of your current Health Maintenance items (your personalized list of preventive services) with a due date:  Health Maintenance Due   Topic Date Due   ??? Shingles Vaccine (1 of 2) 12/28/2002   ??? Glaucoma Screening   12/27/2017   ??? Pneumococcal Vaccine (1 of 2 - PCV13) 12/27/2017

## 2018-02-01 NOTE — Progress Notes (Signed)
This is a Catering manager to [employer]"  Initial Preventive Physical Examination (IPPE) providing Personalized Prevention Plan Services (Performed in the first 12 months of enrollment)    I have reviewed the patient's medical history in detail and updated the computerized patient record.     History     Past Medical History:   Diagnosis Date   ??? Environmental allergies    ??? H/O prostate biopsy around 2000    Negative   ??? HTN (hypertension)    ??? Lumbar disc disease       Past Surgical History:   Procedure Laterality Date   ??? HX COLONOSCOPY  2004    Polyps, per pt.     Current Outpatient Medications   Medication Sig Dispense Refill   ??? azelastine (ASTELIN) 137 mcg (0.1 %) nasal spray USE 1 SPRAY IN EACH NOSTRIL TWICE DAILY. (Patient taking differently: USE 1 SPRAY IN EACH NOSTRIL TWICE DAILY as needed.) 3 Bottle 3   ??? fexofenadine (ALLEGRA) 180 mg tablet Take 180 mg by mouth daily.     ??? fluticasone (FLONASE) 50 mcg/actuation nasal spray USE 2 SPRAYS IN EACH NOSTRIL AT BEDTIME. (Patient taking differently: USE 2 SPRAYS IN EACH NOSTRIL AT BEDTIME as needed.) 16 g 11   ??? aspirin delayed-release 81 mg tablet Take  by mouth daily.       No Known Allergies  Family History   Problem Relation Age of Onset   ??? Cancer Father         prostate ca   ??? Cancer Brother         prostate ca     Social History     Tobacco Use   ??? Smoking status: Never Smoker   ??? Smokeless tobacco: Never Used   Substance Use Topics   ??? Alcohol use: No     Diet, Lifestyle: No special diet    Exercise level: extremely active    Depression Risk Screen     3 most recent PHQ Screens 02/01/2018   Little interest or pleasure in doing things Not at all   Feeling down, depressed, irritable, or hopeless Not at all   Total Score PHQ 2 0     Alcohol Risk Screen   You do not drink alcohol or very rarely.    Functional Ability and Level of Safety   Hearing Loss  Hearing is good.     Vision Screening  Vision is good.  No exam data present      Activities of Daily Living  The  home contains: handrails and grab bars  Patient does total self care    Fall Risk Screen  Fall Risk Assessment, last 12 mths 02/01/2018   Able to walk? Yes   Fall in past 12 months? No       Abuse Screen  Patient is not abused    Screening EKG   EKG order placed: No    Patient Care Team   Patient Care Team:  Nelida Gores, MD as PCP - General (Internal Medicine)     End of Life Planning   Advanced care planning directives were discussed with the patient and /or family/caregiver.     Assessment/Plan   Education and counseling provided:  Are appropriate based on today's review and evaluation        Health Maintenance Due   Topic Date Due   ??? Shingrix Vaccine Age 3> (1 of 2) 12/28/2002   ??? GLAUCOMA SCREENING Q2Y  12/27/2017   ???  Pneumococcal 65+ years (1 of 2 - PCV13) 12/27/2017

## 2018-02-01 NOTE — Progress Notes (Signed)
Comprehensive Visit    [name] is a 67 y.o. male who presents for his comprehensive visit.  Pt is doing OK.  He is retired now.  he does exercise.  No CP or SOB.  His BP's have been up.  He wonders is he has OSA -- ?mild fatigue?          Past Medical History:   Diagnosis Date   ??? Environmental allergies    ??? H/O prostate biopsy around 2000    Negative   ??? HTN (hypertension)    ??? Lumbar disc disease      Past Surgical History:   Procedure Laterality Date   ??? HX COLONOSCOPY  2004    Polyps, per pt.     Current Outpatient Medications   Medication Sig Dispense Refill   ??? lisinopril (PRINIVIL, ZESTRIL) 10 mg tablet Take 1 Tab by mouth daily. 90 Tab 3   ??? azelastine (ASTELIN) 137 mcg (0.1 %) nasal spray USE 1 SPRAY IN EACH NOSTRIL TWICE DAILY. (Patient taking differently: USE 1 SPRAY IN EACH NOSTRIL TWICE DAILY as needed.) 3 Bottle 3   ??? fexofenadine (ALLEGRA) 180 mg tablet Take 180 mg by mouth daily.     ??? fluticasone (FLONASE) 50 mcg/actuation nasal spray USE 2 SPRAYS IN EACH NOSTRIL AT BEDTIME. (Patient taking differently: USE 2 SPRAYS IN EACH NOSTRIL AT BEDTIME as needed.) 16 g 11     No Known Allergies  Social History     Tobacco Use   ??? Smoking status: Never Smoker   ??? Smokeless tobacco: Never Used   Substance Use Topics   ??? Alcohol use: No      Family History   Problem Relation Age of Onset   ??? Cancer Father         prostate ca   ??? Cancer Brother         prostate ca       Fall risk evaluation done.  Depression screen done.      Routine maintenance:          Health Maintenance   Topic Date Due   ??? Shingrix Vaccine Age 65> (1 of 2) 12/28/2002   ??? GLAUCOMA SCREENING Q2Y  12/27/2017   ??? Influenza Age 55 to Adult  03/31/2018   ??? MEDICARE YEARLY EXAM  02/02/2019   ??? Pneumococcal 65+ years (2 of 2 - PCV13) 02/02/2019   ??? COLONOSCOPY  11/24/2020   ??? DTaP/Tdap/Td series (2 - Td) 09/12/2023   ??? Hepatitis C Screening  Completed          Objective:  Visit Vitals  Ht 6' (1.829 m)   Wt 180 lb (81.6 kg)   BMI 24.41 kg/m??        Physical Exam  HEENT -- Pupils round. OP clear.  Neck -- Supple. No JVD. No LAD. No thyromegaly or nodules. No carotid bruits.  Heart -- RRR. No R/M/G.  Lungs -- CTA.  Abd -- Soft. NT/ND. No masses. BS present.  Extremities -- No LE edema b/l.  Prostate -- Slightly enlarged.  No nodules.          Assessment/Plan:      ICD-10-CM ICD-9-CM    1. Essential hypertension -- Needs better control. I10 401.9 AMB POC LIPID PROFILE      AMB POC COMPLETE CBC,AUTOMATED ENTER      AMB POC URINALYSIS DIP STICK AUTO W/O MICRO      Start lisinopril (PRINIVIL, ZESTRIL) 10 mg tablet every day.  2. Welcome to Medicare preventive visit Z00.00 V70.0    3. Environmental allergies Z91.09 V15.09 On medication as above.   4. Elevated glucose R73.09 790.29 AMB POC HEMOGLOBIN A1C   5. Special screening for malignant neoplasm of prostate Z12.5 V76.44 PSA SCREENING (SCREENING)   6. Encounter for immunization Z23 V03.89 PNEUMOCOCCAL POLYSACCHARIDE VACCINE, 23-VALENT, ADULT OR IMMUNOSUPPRESSED PT DOSE,   7. Encounter for long-term (current) use of other medications Z79.899 V58.69 METABOLIC PANEL, BASIC   8. Need for shingles vaccine Z23 V04.89 varicella-zoster recombinant, PF, (SHINGRIX, PF,) 50 mcg/0.5 mL susr injection       Routine maintenance as above.    Further management pending lab results.      Author:  Nelida GoresJ Stephen Urbano Milhouse, MD 02/03/2018 9:12 AM

## 2018-02-02 LAB — BASIC METABOLIC PANEL
BUN: 19 mg/dL (ref 8–27)
Bun/Cre Ratio: 23 NA (ref 10–24)
CO2: 26 mmol/L (ref 20–29)
Calcium: 9.2 mg/dL (ref 8.6–10.2)
Chloride: 103 mmol/L (ref 96–106)
Creatinine: 0.84 mg/dL (ref 0.76–1.27)
EGFR IF NonAfrican American: 92 mL/min/{1.73_m2} (ref >59)
GFR African American: 106 mL/min/{1.73_m2} (ref >59)
Glucose: 104 mg/dL — ABNORMAL HIGH (ref 65–99)
Potassium: 4.9 mmol/L (ref 3.5–5.2)
Sodium: 142 mmol/L (ref 134–144)

## 2018-02-02 LAB — PSA SCREENING: PSA: 2.8 ng/mL (ref 0.0–4.0)

## 2018-02-02 LAB — METABOLIC PANEL, BASIC
BUN/Creatinine ratio: 23 (ref 10–24)
BUN: 19 mg/dL (ref 8–27)
CO2: 26 mmol/L (ref 20–29)
Calcium: 9.2 mg/dL (ref 8.6–10.2)
Chloride: 103 mmol/L (ref 96–106)
Creatinine: 0.84 mg/dL (ref 0.76–1.27)
GFR est AA: 106 mL/min/{1.73_m2} (ref >59)
GFR est non-AA: 92 mL/min/{1.73_m2} (ref >59)
Glucose: 104 mg/dL — ABNORMAL HIGH (ref 65–99)
Potassium: 4.9 mmol/L (ref 3.5–5.2)
Sodium: 142 mmol/L (ref 134–144)

## 2018-02-02 LAB — PSA SCREENING (SCREENING): Prostate Specific Ag: 2.8 ng/mL (ref 0.0–4.0)

## 2018-03-08 ENCOUNTER — Ambulatory Visit: Admit: 2018-03-21 | Attending: Internal Medicine | Primary: Internal Medicine

## 2018-03-08 DIAGNOSIS — G4730 Sleep apnea, unspecified: Secondary | ICD-10-CM

## 2018-03-08 NOTE — Progress Notes (Signed)
Met with pt to go over the sleep apnea machine and pt to take the machine home

## 2018-03-21 ENCOUNTER — Ambulatory Visit: Attending: Internal Medicine

## 2018-03-21 DIAGNOSIS — G4730 Sleep apnea, unspecified: Secondary | ICD-10-CM

## 2018-04-06 NOTE — Telephone Encounter (Signed)
Patient would like for you to call him, its a follow up call.    Pt ph# [phone]

## 2018-04-07 NOTE — Telephone Encounter (Signed)
04/07/18  LMOM  10:29

## 2018-05-17 NOTE — Telephone Encounter (Signed)
Patient is needing records for his sleep study and it needs faxed to     [facility] fax# [phone]

## 2018-05-18 NOTE — Telephone Encounter (Signed)
05/17/18  Records were faxed

## 2018-09-05 MED ORDER — FLUTICASONE 50 MCG/ACTUATION NASAL SPRAY, SUSP
50 mcg/actuation | NASAL | 11 refills | Status: AC
Start: 2018-09-05 — End: ?

## 2018-09-05 MED ORDER — AZELASTINE 137 MCG NASAL SPRAY AEROSOL
137 mcg (0.1 %) | NASAL | 3 refills | Status: DC
Start: 2018-09-05 — End: 2018-11-14

## 2018-09-05 NOTE — Telephone Encounter (Signed)
Pt has a question about one of his medications       Ph# [phone]

## 2018-09-05 NOTE — Telephone Encounter (Signed)
09/05/18   rxs sent to the pharmacy

## 2018-09-05 NOTE — Telephone Encounter (Signed)
09/05/18   Pt will have pharmacy will call us for med refills

## 2018-09-05 NOTE — Telephone Encounter (Signed)
Pt stated that the [facility] has no record of the 2 nasal sprays on file for the Pt so he needs new prescriptions sent to the pharmacy please      Azelastine 135 mcg nasal spray   Fluticasone 50 mcg nasal spray       Ph# [phone]  [facility] Ph# [phone]

## 2018-11-14 ENCOUNTER — Ambulatory Visit: Attending: Internal Medicine

## 2018-11-14 ENCOUNTER — Ambulatory Visit: Admit: 2018-11-14 | Attending: Internal Medicine | Primary: Internal Medicine

## 2018-11-14 DIAGNOSIS — R350 Frequency of micturition: Secondary | ICD-10-CM

## 2018-11-14 LAB — AMB POC URINALYSIS DIP STICK MANUAL W/O MICRO
Bilirubin (UA POC): NEGATIVE
Bilirubin, Urine, POC: NEGATIVE
Blood (UA POC): NEGATIVE
Blood (UA POC): NEGATIVE
Glucose (UA POC): NEGATIVE
Glucose, Urine, POC: NEGATIVE
Ketones (UA POC): NEGATIVE
Ketones, Urine, POC: NEGATIVE
Leukocyte Esterase, Urine, POC: NEGATIVE
Leukocyte esterase (UA POC): NEGATIVE
Nitrite, Urine, POC: NEGATIVE
Nitrites (UA POC): NEGATIVE
Protein (UA POC): NEGATIVE
Protein, Urine, POC: NEGATIVE
Specific Gravity, Urine, POC: 1.02 NA (ref 1.001–1.035)
Specific gravity (UA POC): 1.02 (ref 1.001–1.035)
Urobilinogen (UA POC): 1 (ref 0.2–1)
Urobilinogen, POC: 1 (ref 0.2–1)
pH (UA POC): 7 (ref 4.6–8.0)
pH, Urine, POC: 7 NA (ref 4.6–8.0)

## 2018-11-14 MED ORDER — AZELASTINE 137 MCG NASAL SPRAY AEROSOL
137 mcg (0.1 %) | NASAL | 3 refills | Status: AC
Start: 2018-11-14 — End: ?

## 2018-11-14 MED ORDER — SILDENAFIL 100 MG TAB
100 mg | ORAL_TABLET | ORAL | 5 refills | Status: DC | PRN
Start: 2018-11-14 — End: 2019-08-10

## 2018-11-14 NOTE — Progress Notes (Signed)
Progress Note    Patrick Duarte is a 68 y.o. male and presents with   Chief Complaint   Patient presents with   ??? Other     frequency UA     Pt c/o some urinary frequency.  He blames this on intentionally drinking too much water.  No excessive thirst.  He says he has nocturia about twice a night, especially if he drinks a lot of water.  He is otherwise doing OK, and he continues on medications as reviewed.  He also c/o some ED.  Of note he had a recent LUE melanoma removed.  He continues on medications as reviewed.        All other systems reviewed & negative.      Current Outpatient Medications   Medication Sig Dispense Refill   ??? sildenafil citrate (VIAGRA) 100 mg tablet Take 0.5-1 Tabs by mouth as needed for Erectile Dysfunction. 10 Tab 5   ??? azelastine (ASTELIN) 137 mcg (0.1 %) nasal spray USE 1 SPRAY IN EACH NOSTRIL TWICE DAILY as needed. 3 Bottle 3   ??? fluticasone propionate (FLONASE) 50 mcg/actuation nasal spray USE 2 SPRAYS IN EACH NOSTRIL AT BEDTIME. 16 g 11   ??? lisinopril (PRINIVIL, ZESTRIL) 10 mg tablet Take 1 Tab by mouth daily. 90 Tab 3   ??? fexofenadine (ALLEGRA) 180 mg tablet Take 180 mg by mouth daily.       No Known Allergies  Past Medical History:   Diagnosis Date   ??? Environmental allergies    ??? H/O prostate biopsy around 2000    Negative   ??? HTN (hypertension)    ??? Lumbar disc disease    ??? 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 HX COLONOSCOPY  2004    Polyps, per pt.     Family History   Problem Relation Age of Onset   ??? Cancer Father         prostate ca   ??? Cancer Brother         prostate ca     Social History     Tobacco Use   ??? Smoking status: Never Smoker   ??? Smokeless tobacco: Never Used   Substance Use Topics   ??? Alcohol use: No          Objective:  Visit Vitals  BP 128/75   Ht 6' (1.829 m)   Wt 179 lb (81.2 kg)   BMI 24.28 kg/m??       In NAD. Alert.  Neck -- Supple. No JVD.  Heart -- RRR.   Lungs -- CTA.             Recent Results (from the past 24 hour(s))   AMB POC URINALYSIS DIP STICK MANUAL W/O MICRO    Collection Time: 11/14/18 10:23 AM   Result Value Ref Range    Color (UA POC) Yellow     Clarity (UA POC) Clear     Glucose (UA POC) Negative Negative    Bilirubin (UA POC) Negative Negative    Ketones (UA POC) Negative Negative    Specific gravity (UA POC) 1.020 1.001 - 1.035    Blood (UA POC) Negative Negative    pH (UA POC) 7.0 4.6 - 8.0    Protein (UA POC) Negative Negative    Urobilinogen (UA POC) 1 mg/dL 0.2 - 1    Nitrites (UA POC) Negative  Negative    Leukocyte esterase (UA POC) Negative Negative       Assessment/Plan:    ICD-10-CM ICD-9-CM    1. Frequency of urination R35.0 788.41 AMB POC URINALYSIS DIP STICK MANUAL W/O MICRO.  Recent labs a few months ago did not suggest any DM, and he says he will be here soon for labs again.  Sx's don't sound like BPH.  For now, I think observation is appropriate.  If sx's worsen, etc, then he is to let me know.   2. Erectile dysfunction, unspecified erectile dysfunction type N52.9 607.84 sildenafil citrate (VIAGRA) 100 mg tablet -- Given NTG warning.   3. 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         He says he has a visit here in 2-3 months.        Author:  Nelida Gores, MD 11/14/2018 4:14 PM

## 2018-11-14 NOTE — Progress Notes (Signed)
Progress Note    Patrick Duarte is a 68 y.o. male and presents with   Chief Complaint   Patient presents with   ??? Other     frequency UA     Pt c/o some urinary frequency.  He blames this on intentionally drinking too much water.  No excessive thirst.  He says he has nocturia about twice a night, especially if he drinks a lot of water.  He is otherwise doing OK, and he continues on medications as reviewed.  He also c/o some ED.  Of note he had a recent LUE melanoma removed.  He continues on medications as reviewed.        All other systems reviewed & negative.      Current Outpatient Medications   Medication Sig Dispense Refill   ??? sildenafil citrate (VIAGRA) 100 mg tablet Take 0.5-1 Tabs by mouth as needed for Erectile Dysfunction. 10 Tab 5   ??? azelastine (ASTELIN) 137 mcg (0.1 %) nasal spray USE 1 SPRAY IN EACH NOSTRIL TWICE DAILY as needed. 3 Bottle 3   ??? fluticasone propionate (FLONASE) 50 mcg/actuation nasal spray USE 2 SPRAYS IN EACH NOSTRIL AT BEDTIME. 16 g 11   ??? lisinopril (PRINIVIL, ZESTRIL) 10 mg tablet Take 1 Tab by mouth daily. 90 Tab 3   ??? fexofenadine (ALLEGRA) 180 mg tablet Take 180 mg by mouth daily.       No Known Allergies  Past Medical History:   Diagnosis Date   ??? Environmental allergies    ??? H/O prostate biopsy around 2000    Negative   ??? HTN (hypertension)    ??? Lumbar disc disease    ??? 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 HX COLONOSCOPY  2004    Polyps, per pt.     Family History   Problem Relation Age of Onset   ??? Cancer Father         prostate ca   ??? Cancer Brother         prostate ca     Social History     Tobacco Use   ??? Smoking status: Never Smoker   ??? Smokeless tobacco: Never Used   Substance Use Topics   ??? Alcohol use: No          Objective:  Visit Vitals  BP 128/75   Ht 6' (1.829 m)   Wt 179 lb (81.2 kg)   BMI 24.28 kg/m??       In NAD. Alert.  Neck -- Supple. No JVD.  Heart --  RRR.  Lungs -- CTA.             Recent Results (from the past 24 hour(s))   AMB POC URINALYSIS DIP STICK MANUAL W/O MICRO    Collection Time: 11/14/18 10:23 AM   Result Value Ref Range    Color (UA POC) Yellow     Clarity (UA POC) Clear     Glucose (UA POC) Negative Negative    Bilirubin (UA POC) Negative Negative    Ketones (UA POC) Negative Negative    Specific gravity (UA POC) 1.020 1.001 - 1.035    Blood (UA POC) Negative Negative    pH (UA POC) 7.0 4.6 - 8.0    Protein (UA POC) Negative Negative    Urobilinogen (UA POC) 1 mg/dL 0.2 - 1    Nitrites (UA POC) Negative  Negative    Leukocyte esterase (UA POC) Negative Negative       Assessment/Plan:    ICD-10-CM ICD-9-CM    1. Frequency of urination R35.0 788.41 AMB POC URINALYSIS DIP STICK MANUAL W/O MICRO.  Recent labs a few months ago did not suggest any DM, and he says he will be here soon for labs again.  Sx's don't sound like BPH.  For now, I think observation is appropriate.  If sx's worsen, etc, then he is to let me know.   2. Erectile dysfunction, unspecified erectile dysfunction type N52.9 607.84 sildenafil citrate (VIAGRA) 100 mg tablet -- Given NTG warning.   3. 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         He says he has a visit here in 2-3 months.        Author:  Nelida Gores, MD 11/14/2018 4:14 PM

## 2018-11-16 NOTE — Telephone Encounter (Signed)
11/16/18  LMOM 8:58

## 2018-11-16 NOTE — Telephone Encounter (Signed)
11/16/18   Sinus inf, nose-green, cough-green, no fever, sore throat    NKDA   pt wants to take Ceftin    Pharmacy # [phone]  [facility]

## 2018-11-16 NOTE — Telephone Encounter (Signed)
Patient wants to follow up after his appointment please    Pt ph# [phone]

## 2018-11-16 NOTE — Telephone Encounter (Signed)
11/16/18   Called in rx to the pharmacy [phone] for Ceftin 500mg  1 PO BID times 10 days  #20 with 0 ref   11:42

## 2019-01-09 ENCOUNTER — Encounter

## 2019-01-09 MED ORDER — LISINOPRIL 10 MG TAB
10 mg | ORAL_TABLET | ORAL | 3 refills | Status: DC
Start: 2019-01-09 — End: 2019-08-14

## 2019-02-03 ENCOUNTER — Encounter: Attending: Internal Medicine | Primary: Internal Medicine

## 2019-03-13 ENCOUNTER — Ambulatory Visit: Attending: Internal Medicine

## 2019-03-13 ENCOUNTER — Inpatient Hospital Stay: Admit: 2019-03-13 | Payer: MEDICARE | Attending: Internal Medicine | Primary: Internal Medicine

## 2019-03-13 ENCOUNTER — Ambulatory Visit: Admit: 2019-03-13 | Attending: Internal Medicine | Primary: Internal Medicine

## 2019-03-13 DIAGNOSIS — M1712 Unilateral primary osteoarthritis, left knee: Secondary | ICD-10-CM

## 2019-03-13 DIAGNOSIS — I10 Essential (primary) hypertension: Secondary | ICD-10-CM

## 2019-03-13 LAB — AMB POC URINALYSIS DIP STICK AUTO W/O MICRO
Bilirubin (UA POC): NEGATIVE
Bilirubin, Urine, POC: NEGATIVE
Blood (UA POC): NEGATIVE
Blood (UA POC): NEGATIVE
Glucose (UA POC): NEGATIVE
Glucose, Urine, POC: NEGATIVE
Ketones (UA POC): NEGATIVE
Ketones, Urine, POC: NEGATIVE
Leukocyte Esterase, Urine, POC: NEGATIVE
Leukocyte esterase (UA POC): NEGATIVE
Nitrite, Urine, POC: NEGATIVE
Nitrites (UA POC): NEGATIVE
Protein (UA POC): NEGATIVE
Protein, Urine, POC: NEGATIVE
Specific Gravity, Urine, POC: 1.03 NA (ref 1.001–1.035)
Specific gravity (UA POC): 1.03 (ref 1.001–1.035)
Urobilinogen (UA POC): 0.2 (ref 0.2–1)
Urobilinogen, POC: 0.2 (ref 0.2–1)
pH (UA POC): 6 (ref 4.6–8.0)
pH, Urine, POC: 6 NA (ref 4.6–8.0)

## 2019-03-13 NOTE — Patient Instructions (Signed)
Medicare Wellness Visit, Male    The best way to live healthy is to have a lifestyle where you eat a well-balanced diet, exercise regularly, limit alcohol use, and quit all forms of tobacco/nicotine, if applicable.     Regular preventive services are another way to keep healthy. Preventive services (vaccines, screening tests, monitoring & exams) can help personalize your care plan, which helps you manage your own care. Screening tests can find health problems at the earliest stages, when they are easiest to treat.   [facility] follows the current, evidence-based guidelines published by the [place] [employer] (USPSTF) when recommending preventive services for our patients. Because we follow these guidelines, sometimes recommendations change over time as research supports it. (For example, a prostate screening blood test is no longer routinely recommended for men with no symptoms).  Of course, you and your doctor may decide to screen more often for some diseases, based on your risk and co-morbidities (chronic disease you are already diagnosed with).     Preventive services for you include:  - Medicare offers their members a free annual wellness visit, which is time for you and your primary care provider to discuss and plan for your preventive service needs. Take advantage of this benefit every year!  -All adults over age 56 should receive the recommended pneumonia vaccines. Current USPSTF guidelines recommend a series of two vaccines for the best pneumonia protection.   -All adults should have a flu vaccine yearly and tetanus vaccine every 10 years.  -All adults age 68 and older should receive the shingles vaccines (series of two vaccines).       -All adults age 43-70 who are overweight should have a diabetes screening test once every three years.   -Other screening tests & preventive services for persons with diabetes  include: an eye exam to screen for diabetic retinopathy, a kidney function test, a foot exam, and stricter control over your cholesterol.   -Cardiovascular screening for adults with routine risk involves an electrocardiogram (ECG) at intervals determined by the provider.   -Colorectal cancer screening should be done for adults age 2-75 with no increased risk factors for colorectal cancer.  There are a number of acceptable methods of screening for this type of cancer. Each test has its own benefits and drawbacks. Discuss with your provider what is most appropriate for you during your annual wellness visit. The different tests include: colonoscopy (considered the best screening method), a fecal occult blood test, a fecal DNA test, and sigmoidoscopy.  -All adults born between [place] should be screened once for Hepatitis C.  -An Abdominal Aortic Aneurysm (AAA) Screening is recommended for men age 38-75 who has ever smoked in their lifetime.     Here is a list of your current Health Maintenance items (your personalized list of preventive services) with a due date:  Health Maintenance Due   Topic Date Due   ??? Shingles Vaccine (1 of 2) 12/28/2002   ??? Annual Well Visit  02/02/2019

## 2019-03-13 NOTE — Progress Notes (Signed)
This is the Subsequent Medicare Annual Wellness Exam, performed 12 months or more after the Initial AWV or the last Subsequent AWV    I have reviewed the patient's medical history in detail and updated the computerized patient record.     History     Patient Active Problem List   Diagnosis Code   ??? HTN (hypertension) I10   ??? Environmental allergies Z91.09   ??? 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 Encounter for long-term (current) use of other medications Z79.899     Past Medical History:   Diagnosis Date   ??? Environmental allergies    ??? H/O prostate biopsy around 2000    Negative   ??? HTN (hypertension)    ??? Lumbar disc disease    ??? 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 HX COLONOSCOPY  2004    Polyps, per pt.     Current Outpatient Medications   Medication Sig Dispense Refill   ??? lisinopriL (PRINIVIL, ZESTRIL) 10 mg tablet TAKE 1 TABLET BY MOUTH EVERY DAY 90 Tab 3   ??? sildenafil citrate (VIAGRA) 100 mg tablet Take 0.5-1 Tabs by mouth as needed for Erectile Dysfunction. 10 Tab 5   ??? azelastine (ASTELIN) 137 mcg (0.1 %) nasal spray USE 1 SPRAY IN EACH NOSTRIL TWICE DAILY as needed. 3 Bottle 3   ??? fluticasone propionate (FLONASE) 50 mcg/actuation nasal spray USE 2 SPRAYS IN EACH NOSTRIL AT BEDTIME. 16 g 11   ??? fexofenadine (ALLEGRA) 180 mg tablet Take 180 mg by mouth daily.       No Known Allergies    Family History   Problem Relation Age of Onset   ??? Cancer Father         prostate ca   ??? Cancer Brother         prostate ca     Social History     Tobacco Use   ??? Smoking status: Never Smoker   ??? Smokeless tobacco: Never Used   Substance Use Topics   ??? Alcohol use: No       Depression Risk Factor Screening:     3 most recent PHQ Screens 03/13/2019   Little interest or pleasure in doing things Not at all   Feeling down, depressed, irritable, or hopeless Not at all    Total Score PHQ 2 0       Alcohol Risk Factor Screening (MALE > 65):   Do you average more 1 drink per night or more than 7 drinks a week: No    In the past three months have you have had more than 4 drinks containing alcohol on one occasion: No      Functional Ability and Level of Safety:   Hearing: Hearing is good.     Activities of Daily Living:  The home contains: handrails and grab bars  Patient does total self care     Ambulation: with no difficulty     Fall Risk:  Fall Risk Assessment, last 12 mths 03/13/2019   Able to walk? Yes   Fall in past 12 months? No     Abuse Screen:  Patient is not abused       Cognitive Screening   Has your family/caregiver stated any concerns about your memory: no     Cognitive Screening: Normal - Verbal Fluency Test  Patient Care Team   Patient Care Team:  Nelida Goreshristensen, Kyrstal Monterrosa Stephen, MD as PCP - General (Internal Medicine)    Assessment/Plan   Education and counseling provided:  Are appropriate based on today's review and evaluation    Diagnoses and all orders for this visit:    1. Essential hypertension    2. 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 Environmental allergies    4. Left knee pain, unspecified chronicity    5. Encounter for long-term (current) use of other medications        Health Maintenance Due   Topic Date Due   ??? Shingrix Vaccine Age 59> (1 of 2) 12/28/2002   ??? Medicare Yearly Exam  02/02/2019

## 2019-03-13 NOTE — Progress Notes (Signed)
Comprehensive Visit    Patrick Duarte is a 68 y.o. male who presents for his comprehensive visit.  Pt is doing OK.  he does exercise.  No CP or SOB.  He says his BP's have been good at home.  He c/o some L knee pain.  No known trauma.  He says he sees his dermatologist every 3 months for his melanoma hx.        All other systems reviewed & negative.        Past Medical History:   Diagnosis Date   ??? Environmental allergies    ??? H/O prostate biopsy around 2000    Negative   ??? HTN (hypertension)    ??? Lumbar disc disease    ??? 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 HX COLONOSCOPY  2004    Polyps, per pt.     Current Outpatient Medications   Medication Sig Dispense Refill   ??? lisinopriL (PRINIVIL, ZESTRIL) 10 mg tablet TAKE 1 TABLET BY MOUTH EVERY DAY 90 Tab 3   ??? sildenafil citrate (VIAGRA) 100 mg tablet Take 0.5-1 Tabs by mouth as needed for Erectile Dysfunction. 10 Tab 5   ??? azelastine (ASTELIN) 137 mcg (0.1 %) nasal spray USE 1 SPRAY IN EACH NOSTRIL TWICE DAILY as needed. 3 Bottle 3   ??? fluticasone propionate (FLONASE) 50 mcg/actuation nasal spray USE 2 SPRAYS IN EACH NOSTRIL AT BEDTIME. 16 g 11   ??? fexofenadine (ALLEGRA) 180 mg tablet Take 180 mg by mouth daily.       No Known Allergies  Social History     Tobacco Use   ??? Smoking status: Never Smoker   ??? Smokeless tobacco: Never Used   Substance Use Topics   ??? Alcohol use: No      Family History   Problem Relation Age of Onset   ??? Cancer Father         prostate ca   ??? Cancer Brother         prostate ca       Fall risk evaluation done.  Depression screen done.      Routine maintenance:          Health Maintenance   Topic Date Due   ??? Influenza Age 110 to Adult  04/01/2019   ??? Medicare Yearly Exam  03/13/2020   ??? Colonoscopy  11/24/2020   ??? GLAUCOMA SCREENING Q2Y  02/12/2021   ??? Lipid Screen  02/02/2023   ??? DTaP/Tdap/Td series (2 - Td) 09/12/2023    ??? Hepatitis C Screening  Completed   ??? Shingrix Vaccine Age 14>  Completed   ??? Pneumococcal 65+ years  Completed          Objective:  Visit Vitals  BP 130/78   Ht 6' (1.829 m)   Wt 172 lb (78 kg)   BMI 23.33 kg/m??       Physical Exam  HEENT -- Pupils round.  Neck -- Supple. No JVD. No LAD. No thyromegaly or nodules. No carotid bruits.  Heart -- RRR. No R/M/G.  Lungs -- CTA.  Abd -- Soft. NT/ND. No masses. BS present.  Extremities -- No LE edema b/l.  Neuro -- Grossly non-focal.  Prostate -- Nl size/tone.  No nodules.        L knee Xray -- IMPRESSION: Evidence of mild degenerative change involving the left knee, esp the patella.  Assessment/Plan:      ICD-10-CM ICD-9-CM    1. Essential hypertension  I10 401.9 AMB POC URINALYSIS DIP STICK AUTO W/O MICRO      LIPID PANEL   2. 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 Environmental allergies  Z91.09 V15.09 On Allegra & Flonase as above.   4. Left knee pain, unspecified chronicity  M25.562 719.46 XR KNEE LT 3 V      REFERRAL TO ORTHOPEDICS at his request as this bothers him.   5. Encounter for long-term (current) use of other medications  Z79.899 V58.69 METABOLIC PANEL, COMPREHENSIVE   6. Medicare annual wellness visit, subsequent  Z00.00 V70.0    7. Special screening for malignant neoplasm of prostate  Z12.5 V76.44 PSA SCREENING (SCREENING)       Routine maintenance as above.    Further management pending lab results.      Author:  Nelida GoresJ Stephen Milbern Doescher, MD 03/14/2019 9:28 PM

## 2019-03-13 NOTE — Progress Notes (Signed)
Comprehensive Visit    Patrick Duarte is a 68 y.o. male who presents for his comprehensive visit.  Pt is doing OK.  he does exercise.  No CP or SOB.  He says his BP's have been good at home.  He c/o some L knee pain.  No known trauma.  He says he sees his dermatologist every 3 months for his melanoma hx.        All other systems reviewed & negative.        Past Medical History:   Diagnosis Date   ??? Environmental allergies    ??? H/O prostate biopsy around 2000    Negative   ??? HTN (hypertension)    ??? Lumbar disc disease    ??? 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 HX COLONOSCOPY  2004    Polyps, per pt.     Current Outpatient Medications   Medication Sig Dispense Refill   ??? lisinopriL (PRINIVIL, ZESTRIL) 10 mg tablet TAKE 1 TABLET BY MOUTH EVERY DAY 90 Tab 3   ??? sildenafil citrate (VIAGRA) 100 mg tablet Take 0.5-1 Tabs by mouth as needed for Erectile Dysfunction. 10 Tab 5   ??? azelastine (ASTELIN) 137 mcg (0.1 %) nasal spray USE 1 SPRAY IN EACH NOSTRIL TWICE DAILY as needed. 3 Bottle 3   ??? fluticasone propionate (FLONASE) 50 mcg/actuation nasal spray USE 2 SPRAYS IN EACH NOSTRIL AT BEDTIME. 16 g 11   ??? fexofenadine (ALLEGRA) 180 mg tablet Take 180 mg by mouth daily.       No Known Allergies  Social History     Tobacco Use   ??? Smoking status: Never Smoker   ??? Smokeless tobacco: Never Used   Substance Use Topics   ??? Alcohol use: No      Family History   Problem Relation Age of Onset   ??? Cancer Father         prostate ca   ??? Cancer Brother         prostate ca       Fall risk evaluation done.  Depression screen done.      Routine maintenance:          Health Maintenance   Topic Date Due   ??? Influenza Age 96 to Adult  04/01/2019   ??? Medicare Yearly Exam  03/13/2020   ??? Colonoscopy  11/24/2020   ??? GLAUCOMA SCREENING Q2Y  02/12/2021   ??? Lipid Screen  02/02/2023   ??? DTaP/Tdap/Td series (2 - Td) 09/12/2023   ??? Hepatitis C Screening   Completed   ??? Shingrix Vaccine Age 62>  Completed   ??? Pneumococcal 65+ years  Completed          Objective:  Visit Vitals  BP 130/78   Ht 6' (1.829 m)   Wt 172 lb (78 kg)   BMI 23.33 kg/m??       Physical Exam  HEENT -- Pupils round.  Neck -- Supple. No JVD. No LAD. No thyromegaly or nodules. No carotid bruits.  Heart -- RRR. No R/M/G.  Lungs -- CTA.  Abd -- Soft. NT/ND. No masses. BS present.  Extremities -- No LE edema b/l.  Neuro -- Grossly non-focal.  Prostate -- Nl size/tone.  No nodules.        L knee Xray -- IMPRESSION: Evidence of mild degenerative change involving the left knee, esp the patella.  Assessment/Plan:      ICD-10-CM ICD-9-CM    1. Essential hypertension  I10 401.9 AMB POC URINALYSIS DIP STICK AUTO W/O MICRO      LIPID PANEL   2. 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 Environmental allergies  Z91.09 V15.09 On Allegra & Flonase as above.   4. Left knee pain, unspecified chronicity  M25.562 719.46 XR KNEE LT 3 V      REFERRAL TO ORTHOPEDICS at his request as this bothers him.   5. Encounter for long-term (current) use of other medications  Z79.899 V58.69 METABOLIC PANEL, COMPREHENSIVE   6. Medicare annual wellness visit, subsequent  Z00.00 V70.0    7. Special screening for malignant neoplasm of prostate  Z12.5 V76.44 PSA SCREENING (SCREENING)       Routine maintenance as above.    Further management pending lab results.      Author:  Nelida GoresJ Stephen Irving Bloor, MD 03/14/2019 9:28 PM

## 2019-03-13 NOTE — Progress Notes (Signed)
This is the Subsequent Medicare Annual Wellness Exam, performed 12 months or more after the Initial AWV or the last Subsequent AWV    I have reviewed the patient's medical history in detail and updated the computerized patient record.     History     Patient Active Problem List   Diagnosis Code   ??? HTN (hypertension) I10   ??? Environmental allergies Z91.09   ??? 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 Encounter for long-term (current) use of other medications Z79.899     Past Medical History:   Diagnosis Date   ??? Environmental allergies    ??? H/O prostate biopsy around 2000    Negative   ??? HTN (hypertension)    ??? Lumbar disc disease    ??? 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 HX COLONOSCOPY  2004    Polyps, per pt.     Current Outpatient Medications   Medication Sig Dispense Refill   ??? lisinopriL (PRINIVIL, ZESTRIL) 10 mg tablet TAKE 1 TABLET BY MOUTH EVERY DAY 90 Tab 3   ??? sildenafil citrate (VIAGRA) 100 mg tablet Take 0.5-1 Tabs by mouth as needed for Erectile Dysfunction. 10 Tab 5   ??? azelastine (ASTELIN) 137 mcg (0.1 %) nasal spray USE 1 SPRAY IN EACH NOSTRIL TWICE DAILY as needed. 3 Bottle 3   ??? fluticasone propionate (FLONASE) 50 mcg/actuation nasal spray USE 2 SPRAYS IN EACH NOSTRIL AT BEDTIME. 16 g 11   ??? fexofenadine (ALLEGRA) 180 mg tablet Take 180 mg by mouth daily.       No Known Allergies    Family History   Problem Relation Age of Onset   ??? Cancer Father         prostate ca   ??? Cancer Brother         prostate ca     Social History     Tobacco Use   ??? Smoking status: Never Smoker   ??? Smokeless tobacco: Never Used   Substance Use Topics   ??? Alcohol use: No       Depression Risk Factor Screening:     3 most recent PHQ Screens 03/13/2019   Little interest or pleasure in doing things Not at all   Feeling down, depressed, irritable, or hopeless Not at all   Total Score PHQ 2 0        Alcohol Risk Factor Screening (MALE > 65):   Do you average more 1 drink per night or more than 7 drinks a week: No    In the past three months have you have had more than 4 drinks containing alcohol on one occasion: No      Functional Ability and Level of Safety:   Hearing: Hearing is good.     Activities of Daily Living:  The home contains: handrails and grab bars  Patient does total self care     Ambulation: with no difficulty     Fall Risk:  Fall Risk Assessment, last 12 mths 03/13/2019   Able to walk? Yes   Fall in past 12 months? No     Abuse Screen:  Patient is not abused       Cognitive Screening   Has your family/caregiver stated any concerns about your memory: no     Cognitive Screening: Normal - Verbal Fluency Test  Patient Care Team   Patient Care Team:  Nelida Goreshristensen, Brielynn Sekula Stephen, MD as PCP - General (Internal Medicine)    Assessment/Plan   Education and counseling provided:  Are appropriate based on today's review and evaluation    Diagnoses and all orders for this visit:    1. Essential hypertension    2. 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 Environmental allergies    4. Left knee pain, unspecified chronicity    5. Encounter for long-term (current) use of other medications        Health Maintenance Due   Topic Date Due   ??? Shingrix Vaccine Age 68> (1 of 2) 12/28/2002   ??? Medicare Yearly Exam  02/02/2019

## 2019-03-14 LAB — CBC WITH AUTO DIFFERENTIAL
Basophils %: 0 %
Basophils Absolute: 0 10*3/uL (ref 0.0–0.2)
Eosinophils %: 1 %
Eosinophils Absolute: 0.1 10*3/uL (ref 0.0–0.4)
Granulocyte Absolute Count: 0 10*3/uL (ref 0.0–0.1)
Hematocrit: 47.2 % (ref 37.5–51.0)
Hemoglobin: 15.8 g/dL (ref 13.0–17.7)
Immature Granulocytes: 0 %
Lymphocytes %: 24 %
Lymphocytes Absolute: 1.7 10*3/uL (ref 0.7–3.1)
MCH: 28.5 pg (ref 26.6–33.0)
MCHC: 33.5 g/dL (ref 31.5–35.7)
MCV: 85 fL (ref 79–97)
Monocytes %: 8 %
Monocytes Absolute: 0.6 10*3/uL (ref 0.1–0.9)
Neutrophils %: 67 %
Neutrophils Absolute: 4.7 10*3/uL (ref 1.4–7.0)
Platelets: 200 10*3/uL (ref 150–450)
RBC: 5.55 x10E6/uL (ref 4.14–5.80)
RDW: 12.8 % (ref 11.6–15.4)
WBC: 7 10*3/uL (ref 3.4–10.8)

## 2019-03-14 LAB — COMPREHENSIVE METABOLIC PANEL
ALT: 19 IU/L (ref 0–44)
AST: 16 IU/L (ref 0–40)
Albumin/Globulin Ratio: 2 NA (ref 1.2–2.2)
Albumin: 4.5 g/dL (ref 3.8–4.8)
Alkaline Phosphatase: 63 IU/L (ref 39–117)
BUN: 18 mg/dL (ref 8–27)
Bun/Cre Ratio: 20 NA (ref 10–24)
CO2: 27 mmol/L (ref 20–29)
Calcium: 9.2 mg/dL (ref 8.6–10.2)
Chloride: 100 mmol/L (ref 96–106)
Creatinine: 0.88 mg/dL (ref 0.76–1.27)
EGFR IF NonAfrican American: 90 mL/min/{1.73_m2} (ref >59)
GFR African American: 103 mL/min/{1.73_m2} (ref >59)
Globulin, Total: 2.2 g/dL (ref 1.5–4.5)
Glucose: 101 mg/dL — ABNORMAL HIGH (ref 65–99)
Potassium: 4.6 mmol/L (ref 3.5–5.2)
Sodium: 140 mmol/L (ref 134–144)
Total Bilirubin: 0.7 mg/dL (ref 0.0–1.2)
Total Protein: 6.7 g/dL (ref 6.0–8.5)

## 2019-03-14 LAB — LIPID PANEL
Cholesterol, Total: 174 mg/dL (ref 100–199)
Cholesterol, total: 174 mg/dL (ref 100–199)
HDL Cholesterol: 52 mg/dL (ref >39)
HDL: 52 mg/dL (ref >39)
LDL Calculated: 107 mg/dL — ABNORMAL HIGH (ref 0–99)
LDL, calculated: 107 mg/dL — ABNORMAL HIGH (ref 0–99)
Triglyceride: 75 mg/dL (ref 0–149)
Triglycerides: 75 mg/dL (ref 0–149)
VLDL Cholesterol Calculated: 15 mg/dL (ref 5–40)
VLDL, calculated: 15 mg/dL (ref 5–40)

## 2019-03-14 LAB — PSA SCREENING: PSA: 3 ng/mL (ref 0.0–4.0)

## 2019-03-14 LAB — CBC WITH AUTOMATED DIFF
ABS. BASOPHILS: 0 10*3/uL (ref 0.0–0.2)
ABS. EOSINOPHILS: 0.1 10*3/uL (ref 0.0–0.4)
ABS. IMM. GRANS.: 0 10*3/uL (ref 0.0–0.1)
ABS. MONOCYTES: 0.6 10*3/uL (ref 0.1–0.9)
ABS. NEUTROPHILS: 4.7 10*3/uL (ref 1.4–7.0)
Abs Lymphocytes: 1.7 10*3/uL (ref 0.7–3.1)
BASOPHILS: 0 %
EOSINOPHILS: 1 %
HCT: 47.2 % (ref 37.5–51.0)
HGB: 15.8 g/dL (ref 13.0–17.7)
IMMATURE GRANULOCYTES: 0 %
Lymphocytes: 24 %
MCH: 28.5 pg (ref 26.6–33.0)
MCHC: 33.5 g/dL (ref 31.5–35.7)
MCV: 85 fL (ref 79–97)
MONOCYTES: 8 %
NEUTROPHILS: 67 %
PLATELET: 200 10*3/uL (ref 150–450)
RBC: 5.55 x10E6/uL (ref 4.14–5.80)
RDW: 12.8 % (ref 11.6–15.4)
WBC: 7 10*3/uL (ref 3.4–10.8)

## 2019-03-14 LAB — PSA SCREENING (SCREENING): Prostate Specific Ag: 3 ng/mL (ref 0.0–4.0)

## 2019-03-14 LAB — METABOLIC PANEL, COMPREHENSIVE
A-G Ratio: 2 (ref 1.2–2.2)
ALT (SGPT): 19 IU/L (ref 0–44)
AST (SGOT): 16 IU/L (ref 0–40)
Albumin: 4.5 g/dL (ref 3.8–4.8)
Alk. phosphatase: 63 IU/L (ref 39–117)
BUN/Creatinine ratio: 20 (ref 10–24)
BUN: 18 mg/dL (ref 8–27)
Bilirubin, total: 0.7 mg/dL (ref 0.0–1.2)
CO2: 27 mmol/L (ref 20–29)
Calcium: 9.2 mg/dL (ref 8.6–10.2)
Chloride: 100 mmol/L (ref 96–106)
Creatinine: 0.88 mg/dL (ref 0.76–1.27)
GFR est AA: 103 mL/min/{1.73_m2} (ref >59)
GFR est non-AA: 90 mL/min/{1.73_m2} (ref >59)
GLOBULIN, TOTAL: 2.2 g/dL (ref 1.5–4.5)
Glucose: 101 mg/dL — ABNORMAL HIGH (ref 65–99)
Potassium: 4.6 mmol/L (ref 3.5–5.2)
Protein, total: 6.7 g/dL (ref 6.0–8.5)
Sodium: 140 mmol/L (ref 134–144)

## 2019-08-10 ENCOUNTER — Encounter

## 2019-08-10 MED ORDER — SILDENAFIL 50 MG TAB
50 mg | ORAL_TABLET | ORAL | 3 refills | Status: DC | PRN
Start: 2019-08-10 — End: 2020-03-14

## 2019-08-10 NOTE — Telephone Encounter (Signed)
Pt would like to talk with the nurse about a medication issue please    Ph# [phone]

## 2019-08-10 NOTE — Telephone Encounter (Signed)
08/10/19  LMOM  2:58

## 2019-08-10 NOTE — Telephone Encounter (Signed)
08/10/19  Refilled pts med

## 2019-08-14 ENCOUNTER — Encounter

## 2019-08-14 MED ORDER — LISINOPRIL 10 MG TAB
10 mg | ORAL_TABLET | Freq: Every day | ORAL | 1 refills | Status: DC
Start: 2019-08-14 — End: 2019-12-24

## 2019-12-22 ENCOUNTER — Encounter

## 2019-12-24 MED ORDER — LISINOPRIL 10 MG TAB
10 mg | ORAL_TABLET | ORAL | 3 refills | Status: DC
Start: 2019-12-24 — End: 2020-07-05

## 2020-03-14 MED ORDER — SILDENAFIL 50 MG TAB
50 mg | ORAL_TABLET | ORAL | 2 refills | Status: DC
Start: 2020-03-14 — End: 2020-09-29

## 2020-03-25 ENCOUNTER — Ambulatory Visit: Attending: Internal Medicine

## 2020-03-25 ENCOUNTER — Ambulatory Visit: Admit: 2020-03-25 | Attending: Internal Medicine | Primary: Internal Medicine

## 2020-03-25 DIAGNOSIS — Z0000 Encounter for general adult medical examination without abnormal findings: Secondary | ICD-10-CM

## 2020-03-25 LAB — AMB POC URINALYSIS DIP STICK AUTO W/O MICRO
Bilirubin (UA POC): NEGATIVE
Bilirubin, Urine, POC: NEGATIVE
Blood (UA POC): NEGATIVE
Blood (UA POC): NEGATIVE
Glucose (UA POC): NEGATIVE
Glucose, Urine, POC: NEGATIVE
Ketones (UA POC): NEGATIVE
Ketones, Urine, POC: NEGATIVE
Nitrite, Urine, POC: NEGATIVE
Nitrites (UA POC): NEGATIVE
Protein (UA POC): NEGATIVE
Protein, Urine, POC: NEGATIVE
Specific Gravity, Urine, POC: 1.02 NA (ref 1.001–1.035)
Specific gravity (UA POC): 1.02 (ref 1.001–1.035)
Urobilinogen (UA POC): 1 (ref 0.2–1)
Urobilinogen, POC: 1 (ref 0.2–1)
pH (UA POC): 6.5 (ref 4.6–8.0)
pH, Urine, POC: 6.5 NA (ref 4.6–8.0)

## 2020-03-25 NOTE — Progress Notes (Signed)
Comprehensive Visit    Patrick Duarte is a 69 y.o. male who presents for his comprehensive visit.  Patient is doing okay.  He exercises.  No chest pain or shortness of breath.  He said he is moving soon to [place].  He says he sees his dermatologist every 6 months for melanoma history.  He complains of some mild fatigue.          All other systems reviewed & negative.        Past Medical History:   Diagnosis Date   ??? Environmental allergies    ??? H/O prostate biopsy around 2000    Negative   ??? HTN (hypertension)    ??? Lumbar disc disease    ??? 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 OSA on CPAP      Past Surgical History:   Procedure Laterality Date   ??? HX COLONOSCOPY  2004    Polyps, per pt.     Current Outpatient Medications   Medication Sig Dispense Refill   ??? sildenafil citrate (VIAGRA) 50 mg tablet Take 1 tablet by mouth as needed for erectile dysfunction. 30 Tablet 2   ??? lisinopriL (PRINIVIL, ZESTRIL) 10 mg tablet Take 1 tablet by mouth daily. 90 Tab 3   ??? azelastine (ASTELIN) 137 mcg (0.1 %) nasal spray USE 1 SPRAY IN EACH NOSTRIL TWICE DAILY as needed. 3 Bottle 3   ??? fluticasone propionate (FLONASE) 50 mcg/actuation nasal spray USE 2 SPRAYS IN EACH NOSTRIL AT BEDTIME. 16 g 11   ??? fexofenadine (ALLEGRA) 180 mg tablet Take 180 mg by mouth daily.       No Known Allergies  Social History     Tobacco Use   ??? Smoking status: Never Smoker   ??? Smokeless tobacco: Never Used   Substance Use Topics   ??? Alcohol use: No      Family History   Problem Relation Age of Onset   ??? Cancer Father         prostate ca   ??? Cancer Brother         prostate ca       Fall risk evaluation done.  Depression screen done.      Routine maintenance:          Health Maintenance   Topic Date Due   ??? Flu Vaccine (1) 05/01/2020   ??? Colorectal Cancer Screening Combo  11/24/2020   ??? Medicare Yearly Exam  03/26/2021   ??? DTaP/Tdap/Td series (2 - Td or Tdap) 09/12/2023   ??? Lipid Screen  03/25/2025   ???  Hepatitis C Screening  Completed   ??? Shingrix Vaccine Age 97>  Completed   ??? COVID-19 Vaccine  Completed   ??? Pneumococcal 65+ years  Completed          Objective:  Visit Vitals  BP 125/75   Temp 97.5 ??F (36.4 ??C)   Ht 6' (1.829 m)   Wt 178 lb (80.7 kg)   BMI 24.14 kg/m??       Physical Exam  HEENT -- Pupils round.  Neck -- Supple. No JVD. No LAD. No thyromegaly or nodules. No carotid bruits.  Heart -- RRR. No R/M/G.  Lungs -- CTA.  Abd -- Soft. NT/ND. No masses. BS present.  Extremities -- No LE edema b/l.  Neuro -- Grossly non-focal.  Prostate --slightly enlarged, no nodules.          Assessment/Plan:  ICD-10-CM ICD-9-CM    1. Routine general medical examination at a health care facility  Z00.00 V70.0    2. Essential hypertension  I10 401.9 AMB POC URINALYSIS DIP STICK AUTO W/O MICRO      LIPID PANEL   3. 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 Encounter for long-term (current) use of other medications  Z79.899 V58.69 METABOLIC PANEL, COMPREHENSIVE   5. Medicare annual wellness visit, subsequent  Z00.00 V70.0    6. Special screening for malignant neoplasm of prostate  Z12.5 V76.44 PSA SCREENING (SCREENING)   7. Fatigue, unspecified type  R53.83 780.79 TSH 3RD GENERATION       Routine maintenance as above.    Further management pending lab results.        [Please excuse any unintentional errors that may have escaped proofreading.  Some of this note may have been completed using medical voice recognition software.]    Author:  Nelida Gores, MD 03/26/2020 8:22 PM

## 2020-03-25 NOTE — Progress Notes (Signed)
This is the Subsequent Medicare Annual Wellness Exam, performed 12 months or more after the Initial AWV or the last Subsequent AWV    I have reviewed the patient's medical history in detail and updated the computerized patient record.       Assessment/Plan   Education and counseling provided:  Are appropriate based on today's review and evaluation    1. Routine general medical examination at a health care facility  2. Essential hypertension  3. 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 Encounter for long-term (current) use of other medications       Depression Risk Factor Screening     3 most recent PHQ Screens 03/13/2019   Little interest or pleasure in doing things Not at all   Feeling down, depressed, irritable, or hopeless Not at all   Total Score PHQ 2 0       Alcohol Risk Screen    Do you average more than 1 drink per night or more than 7 drinks a week: No    In the past three months have you have had more than 4 drinks containing alcohol on one occasion: No        Functional Ability and Level of Safety    Hearing: Hearing is good.      Activities of Daily Living:  The home contains: no safety equipment.  Patient does total self care      Ambulation: with no difficulty     Fall Risk:  Fall Risk Assessment, last 12 mths 03/13/2019   Able to walk? Yes   Fall in past 12 months? No      Abuse Screen:  Patient is not abused       Cognitive Screening    Has your family/caregiver stated any concerns about your memory: no     Cognitive Screening: Normal - Clinical impression    Health Maintenance Due     Health Maintenance Due   Topic Date Due   ??? Medicare Yearly Exam  03/13/2020       Patient Care Team   Patient Care Team:  Nelida Gores, MD as PCP - General (Internal Medicine)    History     Patient Active Problem List   Diagnosis Code   ??? HTN (hypertension) I10   ??? Environmental allergies Z91.09   ??? 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  Encounter for long-term (current) use of other medications Z79.899     Past Medical History:   Diagnosis Date   ??? Environmental allergies    ??? H/O prostate biopsy around 2000    Negative   ??? HTN (hypertension)    ??? Lumbar disc disease    ??? 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 OSA on CPAP       Past Surgical History:   Procedure Laterality Date   ??? HX COLONOSCOPY  2004    Polyps, per pt.     Current Outpatient Medications   Medication Sig Dispense Refill   ??? sildenafil citrate (VIAGRA) 50 mg tablet Take 1 tablet by mouth as needed for erectile dysfunction. 30 Tablet 2   ??? lisinopriL (PRINIVIL, ZESTRIL) 10 mg tablet Take 1 tablet by mouth daily. 90 Tab 3   ??? azelastine (ASTELIN) 137 mcg (0.1 %) nasal spray USE 1 SPRAY IN EACH NOSTRIL TWICE DAILY as needed. 3 Bottle 3   ??? fluticasone propionate (FLONASE) 50  mcg/actuation nasal spray USE 2 SPRAYS IN EACH NOSTRIL AT BEDTIME. 16 g 11   ??? fexofenadine (ALLEGRA) 180 mg tablet Take 180 mg by mouth daily.       No Known Allergies    Family History   Problem Relation Age of Onset   ??? Cancer Father         prostate ca   ??? Cancer Brother         prostate ca     Social History     Tobacco Use   ??? Smoking status: Never Smoker   ??? Smokeless tobacco: Never Used   Substance Use Topics   ??? Alcohol use: No         Nelida Gores, MD

## 2020-03-26 LAB — CBC WITH AUTO DIFFERENTIAL
Basophils %: 0 %
Basophils Absolute: 0 10*3/uL (ref 0.0–0.2)
Eosinophils %: 1 %
Eosinophils Absolute: 0 10*3/uL (ref 0.0–0.4)
Granulocyte Absolute Count: 0 10*3/uL (ref 0.0–0.1)
Hematocrit: 46.3 % (ref 37.5–51.0)
Hemoglobin: 15.9 g/dL (ref 13.0–17.7)
Immature Granulocytes: 0 %
Lymphocytes %: 21 %
Lymphocytes Absolute: 1.6 10*3/uL (ref 0.7–3.1)
MCH: 29 pg (ref 26.6–33.0)
MCHC: 34.3 g/dL (ref 31.5–35.7)
MCV: 85 fL (ref 79–97)
Monocytes %: 9 %
Monocytes Absolute: 0.7 10*3/uL (ref 0.1–0.9)
Neutrophils %: 69 %
Neutrophils Absolute: 5.2 10*3/uL (ref 1.4–7.0)
Platelets: 212 10*3/uL (ref 150–450)
RBC: 5.48 x10E6/uL (ref 4.14–5.80)
RDW: 12.3 % (ref 11.6–15.4)
WBC: 7.5 10*3/uL (ref 3.4–10.8)

## 2020-03-26 LAB — COMPREHENSIVE METABOLIC PANEL
ALT: 18 IU/L (ref 0–44)
AST: 20 IU/L (ref 0–40)
Albumin/Globulin Ratio: 2.2 NA (ref 1.2–2.2)
Albumin: 4.7 g/dL (ref 3.8–4.8)
Alkaline Phosphatase: 72 IU/L (ref 48–121)
BUN: 19 mg/dL (ref 8–27)
Bun/Cre Ratio: 24 NA (ref 10–24)
CO2: 25 mmol/L (ref 20–29)
Calcium: 9.2 mg/dL (ref 8.6–10.2)
Chloride: 98 mmol/L (ref 96–106)
Creatinine: 0.78 mg/dL (ref 0.76–1.27)
EGFR IF NonAfrican American: 93 mL/min/{1.73_m2} (ref >59)
GFR African American: 108 mL/min/{1.73_m2} (ref >59)
Globulin, Total: 2.1 g/dL (ref 1.5–4.5)
Glucose: 99 mg/dL (ref 65–99)
Potassium: 4.7 mmol/L (ref 3.5–5.2)
Sodium: 138 mmol/L (ref 134–144)
Total Bilirubin: 0.8 mg/dL (ref 0.0–1.2)
Total Protein: 6.8 g/dL (ref 6.0–8.5)

## 2020-03-26 LAB — LIPID PANEL
Cholesterol, Total: 172 mg/dL (ref 100–199)
Cholesterol, total: 172 mg/dL (ref 100–199)
HDL Cholesterol: 51 mg/dL (ref >39)
HDL: 51 mg/dL (ref >39)
LDL Calculated: 108 mg/dL — ABNORMAL HIGH (ref 0–99)
LDL, calculated: 108 mg/dL — ABNORMAL HIGH (ref 0–99)
Triglyceride: 68 mg/dL (ref 0–149)
Triglycerides: 68 mg/dL (ref 0–149)
VLDL, calculated: 13 mg/dL (ref 5–40)
VLDL: 13 mg/dL (ref 5–40)

## 2020-03-26 LAB — TSH 3RD GENERATION
TSH: 2.16 u[IU]/mL (ref 0.450–4.500)
TSH: 2.16 u[IU]/mL (ref 0.450–4.500)

## 2020-03-26 LAB — PSA SCREENING: PSA: 3.3 ng/mL (ref 0.0–4.0)

## 2020-03-26 LAB — PSA SCREENING (SCREENING): Prostate Specific Ag: 3.3 ng/mL (ref 0.0–4.0)

## 2020-03-26 LAB — CBC WITH AUTOMATED DIFF
ABS. BASOPHILS: 0 10*3/uL (ref 0.0–0.2)
ABS. EOSINOPHILS: 0 10*3/uL (ref 0.0–0.4)
ABS. IMM. GRANS.: 0 10*3/uL (ref 0.0–0.1)
ABS. MONOCYTES: 0.7 10*3/uL (ref 0.1–0.9)
ABS. NEUTROPHILS: 5.2 10*3/uL (ref 1.4–7.0)
Abs Lymphocytes: 1.6 10*3/uL (ref 0.7–3.1)
BASOPHILS: 0 %
EOSINOPHILS: 1 %
HCT: 46.3 % (ref 37.5–51.0)
HGB: 15.9 g/dL (ref 13.0–17.7)
IMMATURE GRANULOCYTES: 0 %
Lymphocytes: 21 %
MCH: 29 pg (ref 26.6–33.0)
MCHC: 34.3 g/dL (ref 31.5–35.7)
MCV: 85 fL (ref 79–97)
MONOCYTES: 9 %
NEUTROPHILS: 69 %
PLATELET: 212 10*3/uL (ref 150–450)
RBC: 5.48 x10E6/uL (ref 4.14–5.80)
RDW: 12.3 % (ref 11.6–15.4)
WBC: 7.5 10*3/uL (ref 3.4–10.8)

## 2020-03-26 LAB — METABOLIC PANEL, COMPREHENSIVE
A-G Ratio: 2.2 (ref 1.2–2.2)
ALT (SGPT): 18 IU/L (ref 0–44)
AST (SGOT): 20 IU/L (ref 0–40)
Albumin: 4.7 g/dL (ref 3.8–4.8)
Alk. phosphatase: 72 IU/L (ref 48–121)
BUN/Creatinine ratio: 24 (ref 10–24)
BUN: 19 mg/dL (ref 8–27)
Bilirubin, total: 0.8 mg/dL (ref 0.0–1.2)
CO2: 25 mmol/L (ref 20–29)
Calcium: 9.2 mg/dL (ref 8.6–10.2)
Chloride: 98 mmol/L (ref 96–106)
Creatinine: 0.78 mg/dL (ref 0.76–1.27)
GFR est AA: 108 mL/min/{1.73_m2} (ref >59)
GFR est non-AA: 93 mL/min/{1.73_m2} (ref >59)
GLOBULIN, TOTAL: 2.1 g/dL (ref 1.5–4.5)
Glucose: 99 mg/dL (ref 65–99)
Potassium: 4.7 mmol/L (ref 3.5–5.2)
Protein, total: 6.8 g/dL (ref 6.0–8.5)
Sodium: 138 mmol/L (ref 134–144)

## 2020-07-04 ENCOUNTER — Encounter

## 2020-07-05 MED ORDER — LISINOPRIL 10 MG TAB
10 mg | ORAL_TABLET | ORAL | 3 refills | Status: DC
Start: 2020-07-05 — End: 2021-06-15

## 2020-07-24 ENCOUNTER — Encounter: Payer: Self-pay | Admitting: Family Medicine

## 2020-07-24 ENCOUNTER — Ambulatory Visit
Admission: RE | Admit: 2020-07-24 | Discharge: 2020-07-24 | Disposition: A | Discharge status: Home / Self Care | Payer: Medicare Other | Source: Ambulatory Visit | Attending: Family Medicine | Admitting: Family Medicine

## 2020-07-24 ENCOUNTER — Other Ambulatory Visit: Payer: Self-pay

## 2020-07-24 ENCOUNTER — Ambulatory Visit: Payer: Medicare Other | Admitting: Family Medicine

## 2020-07-24 VITALS — BP 152/90 | Ht 72.0 in | Wt 170.0 lb

## 2020-07-24 DIAGNOSIS — G8929 Other chronic pain: Secondary | ICD-10-CM | POA: Diagnosis not present

## 2020-07-24 DIAGNOSIS — M545 Low back pain, unspecified: Secondary | ICD-10-CM | POA: Diagnosis not present

## 2020-07-24 MED ORDER — MELOXICAM 15 MG PO TABS: 15.0000 mg | ORAL_TABLET | Freq: Every day | ORAL | 0 refills | Status: DC

## 2020-07-24 MED ORDER — CYCLOBENZAPRINE HCL 5 MG PO TABS: 5.0000 mg | ORAL_TABLET | Freq: Every day | ORAL | 0 refills | Status: DC

## 2020-07-24 NOTE — Progress Notes (Addendum)
   PCP: Velna Hatchet, MD  Subjective:   HPI: Patient is a 69 y.o. 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  He reports that he moved to [facility] about a month ago, and shortly after moving in in which he lifted many boxes, he started having pain in his left low back which radiates into his lateral hip.  He initially was trying treated with ibuprofen, heat, and time however it did not resolve so he presented to his chiropractor, who suspected SI joint dysfunction and after multiple visits he did not have significant provement and in fact started having more muscle tension and pain moving up his back into his upper lumbar spine.  Because of this, his chiropractor recommended being seen here for further evaluation.  Today, patient states that he continues have mostly left-sided low back pain.  He feels like his low back muscles are tight and he does have pain radiating into his lateral hip.  He is not having any pain rating down his foot, no numbness or tingling.  The pain is especially aggravated by extension of the lumbar spine.  He has found ibuprofen, heat and gentle stretching to be helpful.  He has not noticed any numbness, tingling, weakness, saddle anesthesia, or any other cauda equina syndrome type symptoms.   Review of Systems:  Per HPI.   [place], medications and smoking status reviewed.      Objective:  Physical Exam:  No flowsheet data found.   Gen: awake, alert, NAD, comfortable in exam room Pulm: breathing unlabored  Lumbar spine:  Inspection: No evidence of erythema, ecchymosis, swelling edema.  Palpation: No midline spinal tenderness.  Positive paraspinal muscular tenderness.  Nontender to palpation of greater trochanter, or gluteal muscles. ROM: Intact to forward flexion, extension, rotation, and bending.  He does have pain especially with extension. Special tests:  Negative straight leg raise bilaterally, positive pain with facet loading, more severe when loading the left facet.   Assessment & Plan:  1.  Low back pain Differential includes lumbar muscle strain, facet arthropathy, lumbar radiculopathy secondary to DDD, SI joint dysfunction.  I think facet arthropathy is most likely given his symptomatology.  Plan: -X-ray of the lumbar spine -Meloxicam 15 mg daily -Flexeril 5 mg at bedtime as needed -Flexion-based exercises and gentle stretching -Follow-up in 3 to 4 weeks for reevaluation, sooner if needed   Dagoberto Ligas, MD [facility] Fellow 07/24/2020 12:49 PM   I observed and examined the patient with the [facility] resident and agree with assessment and plan.  Note reviewed and modified by me. Ila Mcgill, MD

## 2020-07-30 ENCOUNTER — Telehealth: Payer: Self-pay | Admitting: Family Medicine

## 2020-07-30 NOTE — Telephone Encounter (Signed)
Called pt with results of XR, plan to continue current interventions and follow up in 2-3 weeks.  Dagoberto Ligas, MD

## 2020-08-14 ENCOUNTER — Other Ambulatory Visit: Payer: Self-pay

## 2020-08-14 ENCOUNTER — Ambulatory Visit: Payer: Medicare Other | Admitting: Family Medicine

## 2020-08-14 ENCOUNTER — Encounter: Payer: Self-pay | Admitting: Family Medicine

## 2020-08-14 VITALS — BP 138/78 | Ht 72.0 in | Wt 175.0 lb

## 2020-08-14 DIAGNOSIS — G8929 Other chronic pain: Secondary | ICD-10-CM

## 2020-08-14 DIAGNOSIS — M545 Low back pain, unspecified: Secondary | ICD-10-CM

## 2020-08-14 MED ORDER — CYCLOBENZAPRINE HCL 5 MG PO TABS
5.0000 mg | ORAL_TABLET | Freq: Every day | ORAL | 0 refills | Status: DC
Start: 2020-08-14 — End: 2021-06-25

## 2020-08-14 MED ORDER — MELOXICAM 15 MG PO TABS
15.0000 mg | ORAL_TABLET | Freq: Every day | ORAL | 1 refills | Status: DC
Start: 2020-08-14 — End: 2021-06-25

## 2020-08-14 MED ORDER — CYCLOBENZAPRINE HCL 5 MG PO TABS
5.0000 mg | ORAL_TABLET | Freq: Every day | ORAL | 0 refills | Status: DC
Start: 2020-08-14 — End: 2020-08-14

## 2020-08-14 NOTE — Progress Notes (Addendum)
   PCP: Velna Hatchet, MD  Subjective:   HPI: Patient is a 69 y.o. male here for reevaluation of low back pain.  At last visit, he was having left-sided low back pain with spasms and some radicular symptoms.  X-ray was obtained and showed to space narrowing at L5-S1 and mild degenerative facet disease at multiple levels.  He has been treated conservatively over the last month with home exercise program focusing on flexion, meloxicam initially for 2 weeks daily and then as needed, and Flexeril as needed.  He reports that he has had significant improvement in terms of his muscle spasms and that these are no longer present.  He continues to have some mild left low back pain however feels like it is slowly improving with his home exercises.  He is not having any radiating pain down his legs, no lower extremity weakness, no saddle anesthesia, no bowel bladder incontinence.   Review of Systems:  Per HPI.   [place], medications and smoking status reviewed.      Objective:  Physical Exam:  No flowsheet data found.   Gen: awake, alert, NAD, comfortable in exam room Pulm: breathing unlabored  Lumbar spine:  Inspection: No evidence of erythema, ecchymosis, swelling edema.  Palpation: No midline spinal tenderness. Very mild paraspinal muscular tenderness. Nontender to palpation of greater trochanter, or gluteal muscles. ROM: Intact to forward flexion, extension, rotation, and bending.    Mild pain with extension.  Special tests: Negative straight leg raise bilaterally   Assessment & Plan:  1.  Low back pain Continue to believe that this is mostly facet arthropathy though cannot rule out DDD as cause of his pain.  He has had improvement with home exercises and anti-inflammatories.  We discussed the merits of steroid injections of the lumbar spine, however he and I both agree that this is not necessary at this time.  Plan: -Continue home exercise plan focusing on flexion-based exercises and gentle  stretching -Meloxicam 15 mg daily as needed -Flexeril 5 mg daily as needed for muscle spasm -Follow-up as needed if pain worsens, would consider MRI for planning of epidural steroid injection at that time.   Dagoberto Ligas, MD [facility] Fellow 08/14/2020 3:59 PM  Addendum:  Patient seen in the office by fellow.  His history, exam, plan of care were precepted with me.  Karlton Lemon MD Kirt Boys

## 2020-09-29 MED ORDER — SILDENAFIL 50 MG TAB
50 mg | ORAL_TABLET | ORAL | 3 refills | Status: DC
Start: 2020-09-29 — End: 2020-09-30

## 2020-09-30 MED ORDER — SILDENAFIL 50 MG TAB
50 mg | ORAL_TABLET | ORAL | 0 refills | Status: AC
Start: 2020-09-30 — End: 2021-08-31

## 2021-05-23 NOTE — Telephone Encounter (Signed)
Pt has a question regarding his records and would like to speak with the nurse please call     Ph# [phone]

## 2021-05-23 NOTE — Telephone Encounter (Signed)
05/23/21   spoke with pt

## 2021-05-30 ENCOUNTER — Encounter: Payer: Self-pay | Admitting: Internal Medicine

## 2021-06-14 ENCOUNTER — Encounter

## 2021-06-15 MED ORDER — LISINOPRIL 10 MG TAB
10 mg | ORAL_TABLET | ORAL | 2 refills | Status: AC
Start: 2021-06-15 — End: ?

## 2021-06-17 ENCOUNTER — Other Ambulatory Visit: Payer: Self-pay | Admitting: Urology

## 2021-06-17 DIAGNOSIS — R972 Elevated prostate specific antigen [PSA]: Secondary | ICD-10-CM

## 2021-06-17 DIAGNOSIS — Z125 Encounter for screening for malignant neoplasm of prostate: Secondary | ICD-10-CM

## 2021-06-25 ENCOUNTER — Ambulatory Visit (INDEPENDENT_AMBULATORY_CARE_PROVIDER_SITE_OTHER): Payer: Medicare Other | Admitting: Internal Medicine

## 2021-06-25 VITALS — BP 140/80 | HR 82 | Ht 72.0 in | Wt 179.0 lb

## 2021-06-25 DIAGNOSIS — Z86010 Personal history of colonic polyps: Secondary | ICD-10-CM

## 2021-06-25 MED ORDER — SUTAB 1479-225-188 MG PO TABS: 1.0000 | ORAL_TABLET | Freq: Once | ORAL | 0 refills | Status: AC

## 2021-06-25 NOTE — Progress Notes (Signed)
   Chief Complaint: History of colon polyps  HPI : 70 year old male with history of OSA, HTN, colon polyps presents for discussion about colonoscopy procedure.  Patient states that [place] recently [place] to re[place] medical providers here.  He plans to stay half of the year in [place] and the other half the year in [place] so that he can spend time with his grandchildren.  He knows that he is due for polyp surveillance.  Last colonoscopy was in 2017 done in [place].  Denies rectal bleeding, melena, weight loss, changes in bowel habits.  He has had issues with hemorrhoids in the remote past.  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  Denies nausea, vomiting.  Denies family history of colon cancer to his knowledge.  Denies prior abdominal surgeries.  Denies use of blood thinners.   He is a retired [employer] manager.   Past Medical History:  Diagnosis Date   Colon polyps    Diverticulosis    Hypertension    Sleep apnea      Past Surgical History:  Procedure Laterality Date   HERNIA REPAIR  1963   RETINAL DETACHMENT REPAIR W/ SCLERAL BUCKLE LE  2008   Family History  Problem Relation Age of Onset   Colon cancer Neg Hx    Stomach cancer Neg Hx    Social History   Tobacco Use   Smoking status: Never   Smokeless tobacco: Never  Vaping Use   Vaping Use: Never used  Substance Use Topics   Alcohol use: Never   Drug use: Never   Current Outpatient Medications  Medication Sig Dispense Refill   fexofenadine (ALLEGRA ALLERGY) 180 MG tablet Take 180 mg by mouth daily.     lisinopril (ZESTRIL) 10 MG tablet      No current facility-administered medications for this visit.   No Known Allergies   Review of Systems: All systems reviewed and negative except where noted in HPI.   Physical Exam: Ht 6' (1.829 m)   Wt 179 lb (81.2 kg)   BMI 24.28 kg/m  Constitutional:  Pleasant,well-developed, male in no acute distress. HEENT: Normocephalic and atraumatic. Conjunctivae are normal. No scleral icterus. Cardiovascular: Normal rate, regular rhythm.  Pulmonary/chest: Effort normal and breath sounds normal. No wheezing, rales or rhonchi. Abdominal: Soft, nondistended, nontender. Bowel sounds active throughout. There are no masses palpable. No hepatomegaly. Extremities: No edema Neurological: Alert and oriented to person place and time. Skin: Skin is warm and dry. No rashes noted. Psychiatric: Normal mood and affect. Behavior is normal.  No labs to review in EMR, but patient states that he had a full set of labs performed recently that were unremarkable except for a slightly elevated PSA  Colonoscopy 10/17/12: 3 small polyps (all tubular adenomas). Recommended 3 year follow up  Colonoscopy 11/25/15: Diverticulosis. Recommended colonoscopy in 5 years.  ASSESSMENT AND PLAN: History of colon polyps Patient had 3 tubular adenomas on colonoscopy in 2014.  Follow-up colonoscopy in 2017 showed only diverticulosis with no polyps.  Thus he is due for polyp surveillance at this time.  I went over the risks and benefits of the procedure in detail with the patient.  He is agreeable to proceeding. - Colonoscopy [facility]  [name], MD

## 2021-06-25 NOTE — Patient Instructions (Signed)
If you are age 68 or older, your body mass index should be between 23-30. Your Body mass index is 24.28 kg/m. If this is out of the aforementioned range listed, please consider follow up with your Primary Care Provider.  If you are age 81 or younger, your body mass index should be between 19-25. Your Body mass index is 24.28 kg/m. If this is out of the aformentioned range listed, please consider follow up with your Primary Care Provider.   ________________________________________________________  The [facility] providers would like to encourage you to use [facility] to communicate with providers for non-urgent requests or questions.  Due to long hold times on the telephone, sending your provider a message by [facility] may be a faster and more efficient way to get a response.  Please allow 48 business hours for a response.  Please remember that this is for non-urgent requests.  _______________________________________________________  Isaiah Greer have been scheduled for a colonoscopy. Please follow written instructions given to you at your visit today.  Please pick up your prep supplies at the pharmacy within the next 1-3 days. If you use inhalers (even only as needed), please bring them with you on the day of your procedure.

## 2021-06-26 ENCOUNTER — Encounter: Payer: Self-pay | Admitting: *Deleted

## 2021-06-30 ENCOUNTER — Ambulatory Visit: Payer: Medicare Other | Admitting: Neurology

## 2021-06-30 ENCOUNTER — Encounter: Payer: Self-pay | Admitting: Neurology

## 2021-06-30 VITALS — BP 152/86 | HR 69 | Ht 72.0 in | Wt 180.0 lb

## 2021-06-30 DIAGNOSIS — G4733 Obstructive sleep apnea (adult) (pediatric): Secondary | ICD-10-CM

## 2021-06-30 DIAGNOSIS — Z9989 Dependence on other enabling machines and devices: Secondary | ICD-10-CM | POA: Diagnosis not present

## 2021-06-30 NOTE — Progress Notes (Addendum)
Subjective:    Patient ID: Isaiah Greer is a 70 y.o. male.  HPI    Star Age, MD, PhD [facility] [address] [place], [place] [address]  Dear Isaiah Greer,   I saw your patient, Isaiah Greer, upon your kind request in my sleep clinic today for initial consultation of his obstructive sleep apnea.  The patient is unaccompanied today.  As you know, Isaiah Greer is a 70 year old right-handed gentleman, retired [employer] manager, with an underlying medical history of diverticulosis, colonic polyps, history of detached retina, hypertension, melanoma, low back pain and sciatica, who was previously diagnosed with obstructive sleep apnea and placed on CPAP therapy.  I reviewed office notes from 04/21/2021.  His Epworth sleepiness score is 9/24, fatigue severity score is 15/63.  He had sleep testing in [place], [place].  Study was performed on 11/20/2019.  His overall AHI was 17.2/h, O2 nadir was 82%.  He was on a dental device at the time.  I was able to review his compliance data from his AutoPap machine.  He received a new machine recently because of the Philips Respironics PAP machine recall.  I reviewed his compliance data from 05/31/2021 through 06/29/2021, which is a total of 30 days, during which time he used his machine every night except for 2 nights, percent use days greater than 4 hours is 93.3%, indicating excellent compliance with an average usage of 6 hours and 13 minutes, residual AHI at goal at 4.2/h, 90th percentile of pressure at 8 cm with a range of 5 to 12 cm.  Of note, he had a home sleep test on 03/30/2018 and I was able to review the results: Total AHI was 17/h, average oxygen saturation 97%, nadir was 76%.  He was placed on AutoPap therapy.  He then tried a dental device to a dentist friend of his but it was not as effective in treating his sleep apnea and so he went back to using his AutoPap.  His machine was recalled and he just got a new  machine a week ago.  His DME company is [employer].  He is married and lives with his wife.  He has a son who lives in [place] and daughter lives in [place], [place].  He continues to [place] after his retirement.  He is a non-smoker, does not utilize alcohol or caffeine.  Bedtime is generally between 10 and 11 PM and rise time between 6 and 7 AM.  He denies recurrent morning headaches.  He has nocturia about once per average night.  He benefits from treating his sleep apnea with his AutoPap although he would like to consider going back to a dental device at some point if possible.  For now, he is content using his AutoPap, uses extra small nasal pillows.  He is up-to-date with his supplies.  Benefit includes sleep consolidation and good sleep quality, less daytime somnolence from treating sleep apnea with an AutoPap.  Weight has been more or less stable.  He had a tonsillectomy and adenoidectomy in elementary school.  He is new dentist is Isaiah Greer, she also provides treatment for sleep apnea with a dental device as I understand.  On 08/15/2021, Addendum: I have received records from [facility].  Patient's had an office visit on 12/10/2020 with the sleep clinic.  He was seen by Isaiah Greer and was compliant with his CPAP machine.  Recall of his Philips machine was discussed.  He endorsed  benefit from treatment and was advised to follow-up in 1 year.  He had an office visit with Isaiah Greer, [place] on 03/05/2020, at which time he was advised to continue with CPAP, follow-up in 9 months, recall notification from June 2021 was discussed. He had an office visit with the PA on 11/28/2019, at which time he was advised to continue with CPAP and follow-up in 3 months.  He had an appointment with Isaiah Greer on 08/11/2019, at which time patient was advised to continue with CPAP and follow-up in 3 months, his HST results with an AHI of 17/h were discussed. He had an appointment with Isaiah Greer on  02/09/2019, at which time his compliance with his machine was good and AHI well-controlled.  He was advised to follow-up in 6 months. He had an office visit with Isaiah Greer on 07/20/2018, at which time his AHI was mildly elevated and he was advised to increase his AutoPap to 5 to 12 cm.  He was advised to follow-up in 6 months. He had an office visit with Isaiah Greer on 05/13/2018, at which time he had a mask fit appointment and was started on AutoPap of 5 to 10 cm.  His Past Medical History Is Significant For: Past Medical History:  Diagnosis Date   Colon polyps    Detached retina    Diverticulosis    Dry eyes    Hypertension    Melanoma ([place])    L upper arm   OSA on CPAP    Sciatica of right side    L sised SI joint pain   Sleep apnea     His Past Surgical History Is Significant For: Past Surgical History:  Procedure Laterality Date   GANGLION CYST EXCISION     R hand 05-31-2010   HERNIA REPAIR  1963   MELANOMA EXCISION  2019   RETINAL DETACHMENT REPAIR W/ SCLERAL BUCKLE LE  2008   RETINAL TEAR REPAIR CRYOTHERAPY  11/29/1993   TONSILLECTOMY  1959    His Family History Is Significant For: Family History  Problem Relation Age of Onset   Heart disease Mother    Heart failure Mother    Prostate cancer Father    Hypertension Father    Prostate cancer Brother    Colon cancer Neg Hx    Stomach cancer Neg Hx     His Social History Is Significant For: Social History   Socioeconomic History   Marital status: Married    Spouse name: Not on file   Number of children: Not on file   Years of education: Not on file   Highest education level: Not on file  Occupational History   Not on file  Tobacco Use   Smoking status: Never   Smokeless tobacco: Never  Vaping Use   Vaping Use: Never used  Substance and Sexual Activity   Alcohol use: Never   Drug use: Never   Sexual activity: Not on file  Other Topics Concern   Not on file  Social History Narrative   Caffeine none.  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  Consult.     Social Determinants of Health   Financial Resource Strain: Not on file  Food Insecurity: Not on file  Transportation Needs: Not on file  Physical Activity: Not on file  Stress: Not on file  Social Connections: Not on file    His Allergies Are:  No Known Allergies:   His Current Medications Are:  Outpatient Encounter Medications as  of 06/30/2021  Medication Sig   azelastine (ASTELIN) 0.1 % nasal spray Place 2 sprays into both nostrils daily. Use in each nostril as directed   fexofenadine (ALLEGRA) 180 MG tablet Take 180 mg by mouth daily.   fluticasone (FLONASE) 50 MCG/ACT nasal spray Place 1 spray into both nostrils daily.   lisinopril (ZESTRIL) 10 MG tablet Take 10 mg by mouth daily.   Omega-3 Fatty Acids (FISH OIL) 1000 MG CAPS Take by mouth daily.   sildenafil (VIAGRA) 50 MG tablet Take 50 mg by mouth daily as needed for erectile dysfunction.   triamcinolone cream (KENALOG) 0.1 % Apply 1 application topically as needed.   No facility-administered encounter medications on file as of 06/30/2021.  :   Review of Systems:  Out of a complete 14 point review of systems, all are reviewed and negative with the exception of these symptoms as listed below:   Review of Systems  Neurological:        OSA on CPAP.  [employer] new machine since last week.  Transfer of care.  ESS 9, FSS 15.   Objective:  Neurological Exam  Physical Exam Physical Examination:   Vitals:   06/30/21 1239  BP: (!) 152/86  Pulse: 69    General Examination: The patient is a very pleasant 70 y.o. male in no acute distress. He appears well-developed and well-nourished and well groomed.   HEENT: Normocephalic, atraumatic, pupils are equal, round and reactive to light, extraocular tracking is good without limitation to gaze excursion or nystagmus noted. Hearing is grossly intact. Face is symmetric with normal facial animation. Speech is  clear with no dysarthria noted. There is no hypophonia. There is no lip, neck/head, jaw or voice tremor. Neck is supple with full range of passive and active motion. There are no carotid bruits on auscultation. Oropharynx exam reveals: Significant mouth dryness, good dental hygiene, mild airway crowding secondary to redundant soft palate and slightly elongated uvula.  Mallampati class I.  Neck circumference of 16 inches.  Tonsils are absent.  He has a minimal to mild overbite.  Tongue protrudes centrally and palate elevates symmetrically.   Chest: Clear to auscultation without wheezing, rhonchi or crackles noted.  Heart: S1+S2+0, regular and normal without murmurs, rubs or gallops noted.   Abdomen: Soft, non-tender and non-distended with normal bowel sounds appreciated on auscultation.  Extremities: There is no pitting edema in the distal lower extremities bilaterally.   Skin: Warm and dry without trophic changes noted.   Musculoskeletal: exam reveals no obvious joint deformities, tenderness or joint swelling or erythema.   Neurologically:  Mental status: The patient is awake, alert and oriented in all 4 spheres. His immediate and remote memory, attention, language skills and fund of knowledge are appropriate. There is no evidence of aphasia, agnosia, apraxia or anomia. Speech is clear with normal prosody and enunciation. Thought process is linear. Mood is normal and affect is normal.  Cranial nerves II - XII are as described above under HEENT exam.  Motor exam: Normal bulk, strength and tone is noted. There is no tremor, fine motor skills and coordination: grossly intact.  Cerebellar testing: No dysmetria or intention tremor. There is no truncal or gait ataxia.  Sensory exam: intact to light touch in the upper and lower extremities.  Gait, station and balance: He stands easily. No veering to one side is noted. No leaning to one side is noted. Posture is age-appropriate and stance is narrow  based. Gait shows normal stride length and  normal pace. No problems turning are noted.   Assessment and Plan:   In summary, Ashden Sonnenberg is a very pleasant 70 y.o.-year old male with an underlying medical history of diverticulosis, colonic polyps, history of detached retina, hypertension, melanoma, low back pain and sciatica, who presents for evaluation of his obstructive sleep apnea which was deemed in the moderate range by home sleep testing in 2019.  He had another home sleep test while he was using his dental device in 2021 but the device was not as effective as AutoPap therapy.  He is currently compliant with his AutoPap, received a recent new machine as a replacement from his old machine that was recalled.  He had been compliant with his old AutoPap machine as he was not using an ozone based cleaner and had an extra filter for it until he received a replacement.  He is up-to-date with his supplies, he is compliant with treatment and continues to benefit from treatment.  He is at this point advised to follow-up in this clinic in 1 year from now.  He is commended for his treatment adherence.  He is motivated to continue with treatment.  He may talk to his dentist about rechecking the efficacy of his dental device or getting it readjusted.  His device is currently in [place] as he spends some time in [place] close to his daughter's family and sometime in [place] to be closer to his son.  He does not currently need a prescription for supplies.  He is advised to call our office if he does need an updated prescription, we can send the order to his DME company directly.  He is advised to follow-up in 1 year, sooner if needed.  I answered all his questions today and he was in agreement.   Thank you very much for allowing me to participate in the care of this nice patient. If I can be of any further assistance to you please do not hesitate to call me at [phone].  Sincerely,   Star Age, MD,  PhD

## 2021-06-30 NOTE — Patient Instructions (Signed)
It was nice to meet you today.  I am glad to hear that you have received a new AutoPap machine since the previous machine was recalled.  You are compliant with your AutoPap treatment and continue to benefit from treatment.  Please follow-up routinely in this clinic in 1 year.  If you decide to pursue treatment with a dental device, I can certainly facilitate with a referral if need be.  You may need an adjustment on your dental device depending on how old your dental device is.   For now, please continue using your autoPAP regularly. While your insurance requires that you use PAP at least 4 hours each night on 70% of the nights, I recommend, that you not skip any nights and use it throughout the night if you can. Getting used to PAP and staying with the treatment long term does take time and patience and discipline. Untreated obstructive sleep apnea when it is moderate to severe can have an adverse impact on cardiovascular health and raise her risk for heart disease, arrhythmias, hypertension, congestive heart failure, stroke and diabetes. Untreated obstructive sleep apnea causes sleep disruption, nonrestorative sleep, and sleep deprivation. This can have an impact on your day to day functioning and cause daytime sleepiness and impairment of cognitive function, memory loss, mood disturbance, and problems focussing. Using PAP regularly can improve these symptoms.

## 2021-08-14 ENCOUNTER — Ambulatory Visit (AMBULATORY_SURGERY_CENTER): Payer: Medicare Other | Admitting: Internal Medicine

## 2021-08-14 ENCOUNTER — Encounter: Payer: Self-pay | Admitting: Internal Medicine

## 2021-08-14 VITALS — BP 111/68 | HR 60 | Temp 98.7°F | Resp 14 | Ht 72.0 in | Wt 179.0 lb

## 2021-08-14 DIAGNOSIS — Z86010 Personal history of colonic polyps: Secondary | ICD-10-CM

## 2021-08-14 DIAGNOSIS — K621 Rectal polyp: Secondary | ICD-10-CM | POA: Diagnosis not present

## 2021-08-14 DIAGNOSIS — K635 Polyp of colon: Secondary | ICD-10-CM

## 2021-08-14 DIAGNOSIS — D120 Benign neoplasm of cecum: Secondary | ICD-10-CM

## 2021-08-14 DIAGNOSIS — D128 Benign neoplasm of rectum: Secondary | ICD-10-CM

## 2021-08-14 DIAGNOSIS — K639 Disease of intestine, unspecified: Secondary | ICD-10-CM | POA: Diagnosis not present

## 2021-08-14 HISTORY — PX: COLONOSCOPY: SHX174

## 2021-08-14 MED ORDER — SODIUM CHLORIDE 0.9 % IV SOLN: 500.0000 mL | Freq: Once | INTRAVENOUS | Status: DC

## 2021-08-14 NOTE — Progress Notes (Signed)
GASTROENTEROLOGY PROCEDURE H&P NOTE   Primary Care Physician: Velna Hatchet, MD    Reason for Procedure:   History of colon polyps  Plan:    Colonoscopy  Patient is appropriate for endoscopic procedure(s) in the ambulatory ([facility]) setting.  The nature of the procedure, as well as the risks, benefits, and alternatives were carefully and thoroughly reviewed with the patient. Ample time for discussion and questions allowed. The patient understood, was satisfied, and agreed to proceed.     HPI: Isaiah Greer is a 70 y.o. male who presents for colonoscopy for polyp surveillance .  Patient was most recently seen in the Gastroenterology Clinic on 06/25/21.  No interval change in medical history since that appointment. Please refer to that note for full details regarding GI history and clinical presentation.   Past Medical History:  Diagnosis Date   Colon polyps    Detached retina    Diverticulosis    Dry eyes    Hypertension    Melanoma ([place])    L upper arm   OSA on CPAP    Sciatica of right side    L sised SI joint pain   Sleep apnea     Past Surgical History:  Procedure Laterality Date   COLONOSCOPY  08/14/2021   Dr. Susy [place] CYST EXCISION     R hand 05-31-2010   [place]   MELANOMA EXCISION  2019   RETINAL DETACHMENT REPAIR W/ SCLERAL BUCKLE LE  2008   RETINAL TEAR REPAIR CRYOTHERAPY  11/29/1993   TONSILLECTOMY  1959    Prior to Admission medications   Medication Sig Start Date End Date Taking? Authorizing Provider  azelastine (ASTELIN) 0.1 % nasal spray Place 2 sprays into both nostrils daily. Use in each nostril as directed   Yes [provider]  fexofenadine (ALLEGRA) 180 MG tablet Take 180 mg by mouth daily.   Yes [provider]  fluticasone (FLONASE) 50 MCG/ACT nasal spray Place 1 spray into both nostrils daily.   Yes [provider]  lisinopril (ZESTRIL) 10 MG tablet Take 10 mg by mouth daily. 06/25/09  Yes  [provider]  Omega-3 Fatty Acids (FISH OIL) 1000 MG CAPS Take by mouth daily.   Yes [provider]  sildenafil (VIAGRA) 50 MG tablet Take 50 mg by mouth daily as needed for erectile dysfunction.   Yes [provider]  triamcinolone cream (KENALOG) 0.1 % Apply 1 application topically as needed.    [provider]    Current Outpatient Medications  Medication Sig Dispense Refill   azelastine (ASTELIN) 0.1 % nasal spray Place 2 sprays into both nostrils daily. Use in each nostril as directed     fexofenadine (ALLEGRA) 180 MG tablet Take 180 mg by mouth daily.     fluticasone (FLONASE) 50 MCG/ACT nasal spray Place 1 spray into both nostrils daily.     lisinopril (ZESTRIL) 10 MG tablet Take 10 mg by mouth daily.     Omega-3 Fatty Acids (FISH OIL) 1000 MG CAPS Take by mouth daily.     sildenafil (VIAGRA) 50 MG tablet Take 50 mg by mouth daily as needed for erectile dysfunction.     triamcinolone cream (KENALOG) 0.1 % Apply 1 application topically as needed.     Current Facility-Administered Medications  Medication Dose Route Frequency Provider Last Rate Last Admin   0.9 %  sodium chloride infusion  500 mL Intravenous Once Sharyn Creamer, MD  Allergies as of 08/14/2021   (No Known Allergies)    Family History  Problem Relation Age of Onset   Heart disease Mother    Heart failure Mother    Prostate cancer Father    Hypertension Father    Prostate cancer Brother    Colon cancer Neg Hx    Stomach cancer Neg Hx    Esophageal cancer Neg Hx    Rectal cancer Neg Hx     Social History   Socioeconomic History   Marital status: Married    Spouse name: Not on file   Number of children: Not on file   Years of education: Not on file   Highest education level: Not on file  Occupational History   Not on file  Tobacco Use   Smoking status: Never   Smokeless tobacco: Never  Vaping Use   Vaping Use: Never used  Substance and Sexual Activity    Alcohol use: Never   Drug use: Never   Sexual activity: Not on file  Other Topics Concern   Not on file  Social History Narrative   Caffeine none. 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  Consult.     Social Determinants of Health   Financial Resource Strain: Not on file  Food Insecurity: Not on file  Transportation Needs: Not on file  Physical Activity: Not on file  Stress: Not on file  Social Connections: Not on file  Intimate Partner Violence: Not on file    Physical Exam: Vital signs in last 24 hours: BP 130/68    Pulse 62    Temp 98.7 F (37.1 C)    Ht 6' (1.829 m)    Wt 179 lb (81.2 kg)    SpO2 97%    BMI 24.28 kg/m  GEN: NAD EYE: Sclerae anicteric ENT: MMM CV: Non-tachycardic Pulm: No increased WOB GI: Soft NEURO:  Alert & Oriented   [name], MD [facility]   08/14/2021 11:50 AM

## 2021-08-14 NOTE — Progress Notes (Signed)
PT taken to PACU. Monitors in place. VSS. Report given to RN. 

## 2021-08-14 NOTE — Op Note (Addendum)
[facility] Patient Name: Isaiah Greer Procedure Date: 08/14/2021 11:50 AM MRN: [id number] Endoscopist: Sonny Masters "Isaiah Greer ,  Age: 70 Referring MD:  Date of Birth: [birth year] Gender: Male Account #: [id number] Procedure:                Colonoscopy Indications:              High risk colon cancer surveillance: Personal                            history of colonic polyps Medicines:                Monitored Anesthesia Care Procedure:                Pre-Anesthesia Assessment:                           - Prior to the procedure, a History and Physical                            was performed, and patient medications and                            allergies were reviewed. The patient's tolerance of                            previous anesthesia was also reviewed. The risks                            and benefits of the procedure and the sedation                            options and risks were discussed with the patient.                            All questions were answered, and informed consent                            was obtained. Prior Anticoagulants: The patient has                            taken no previous anticoagulant or antiplatelet                            agents. ASA Grade Assessment: II - A patient with                            mild systemic disease. After reviewing the risks                            and benefits, the patient was deemed in                            satisfactory condition to undergo the procedure.  After obtaining informed consent, the colonoscope                            was passed under direct vision. Throughout the                            procedure, the patient's blood pressure, pulse, and                            oxygen saturations were monitored continuously. The                            Olympus CF-HQ190L [id number]) Colonoscope was                            introduced through the anus and  advanced to the the                            terminal ileum. The colonoscopy was performed                            without difficulty. The patient tolerated the                            procedure well. The quality of the bowel                            preparation was good. The terminal ileum, ileocecal                            valve, appendiceal orifice, and rectum were                            photographed. Scope In: 11:56:30 AM Scope Out: 12:18:54 PM Scope Withdrawal Time: 0 hours 14 minutes 51 seconds  Total Procedure Duration: 0 hours 22 minutes 24 seconds  Findings:                 The terminal ileum appeared normal.                           Two sessile polyps were found in the rectum and                            cecum. The polyps were 3 to 10 mm in size.                           A few diverticula were found in the entire colon.                           Non-bleeding internal hemorrhoids were found during                            retroflexion. Complications:  No immediate complications. Estimated Blood Loss:     Estimated blood loss was minimal. Impression:               - The examined portion of the ileum was normal.                           - Two 3 to 10 mm polyps in the rectum and in the                            cecum.                           - Diverticulosis in the entire examined colon.                           - Non-bleeding internal hemorrhoids.                           - No specimens collected. Recommendation:           - Discharge patient to home (with escort).                           - Await pathology results.                           - The findings and recommendations were discussed                            with the patient. [id number] Coffee CourtChristia Greer,  08/14/2021 12:22:57 PM

## 2021-08-14 NOTE — Progress Notes (Signed)
Vital signs checked by:CW ? ?The medical and surgical history was reviewed and verified with the patient. ? ?

## 2021-08-14 NOTE — Progress Notes (Signed)
Called to room to assist during endoscopic procedure.  Patient ID and intended procedure confirmed with present staff. Received instructions for my participation in the procedure from the performing physician.  

## 2021-08-14 NOTE — Patient Instructions (Signed)
Handouts on polyps, hemorrhoids, and diverticulosis given to you today  YOU HAD AN ENDOSCOPIC PROCEDURE TODAY AT [facility]:   Refer to the procedure report that was given to you for any specific questions about what was found during the examination.  If the procedure report does not answer your questions, please call your gastroenterologist to clarify.  If you requested that your care partner not be given the details of your procedure findings, then the procedure report has been included in a sealed envelope for you to review at your convenience later.  YOU SHOULD EXPECT: Some feelings of bloating in the abdomen. Passage of more gas than usual.  Walking can help get rid of the air that was put into your GI tract during the procedure and reduce the bloating. If you had a lower endoscopy (such as a colonoscopy or flexible sigmoidoscopy) you may notice spotting of blood in your stool or on the toilet paper. If you underwent a bowel prep for your procedure, you may not have a normal bowel movement for a few days.  Please Note:  You might notice some irritation and congestion in your nose or some drainage.  This is from the oxygen used during your procedure.  There is no need for concern and it should clear up in a day or so.  SYMPTOMS TO REPORT IMMEDIATELY:  Following lower endoscopy (colonoscopy or flexible sigmoidoscopy):  Excessive amounts of blood in the stool  Significant tenderness or worsening of abdominal pains  Swelling of the abdomen that is new, acute  Fever of 100F or higher  For urgent or emergent issues, a gastroenterologist can be reached at any hour by calling [phone]. Do not use MyChart messaging for urgent concerns.    DIET:  We do recommend a small meal at first, but then you may proceed to your regular diet.  Drink plenty of fluids but you should avoid alcoholic beverages for 24 hours.  ACTIVITY:  You should plan to take it easy for the rest of today  and you should NOT DRIVE or use heavy machinery until tomorrow (because of the sedation medicines used during the test).    FOLLOW UP: Our staff will call the number listed on your records 48-72 hours following your procedure to check on you and address any questions or concerns that you may have regarding the information given to you following your procedure. If we do not reach you, we will leave a message.  We will attempt to reach you two times.  During this call, we will ask if you have developed any symptoms of COVID 19. If you develop any symptoms (ie: fever, flu-like symptoms, shortness of breath, cough etc.) before then, please call [phone].  If you test positive for Covid 19 in the 2 weeks post procedure, please call and report this information to [place].    If any biopsies were taken you will be contacted by phone or by letter within the next 1-3 weeks.  Please call us at [phone] if you have not heard about the biopsies in 3 weeks.    SIGNATURES/CONFIDENTIALITY: You and/or your care partner have signed paperwork which will be entered into your electronic medical record.  These signatures attest to the fact that that the information above on your After Visit Summary has been reviewed and is understood.  Full responsibility of the confidentiality of this discharge information lies with you and/or your care-partner.

## 2021-08-18 ENCOUNTER — Telehealth: Payer: Self-pay

## 2021-08-18 NOTE — Telephone Encounter (Signed)
°  Follow up Call-  Call back number 08/14/2021  Post procedure Call Back phone  # [phone]  Permission to leave phone message Yes  Some recent data might be hidden     Patient questions:  Do you have a fever, pain , or abdominal swelling? No. Pain Score  0 *  Have you tolerated food without any problems? Yes.    Have you been able to return to your normal activities? Yes.    Do you have any questions about your discharge instructions: Diet   No. Medications  No. Follow up visit  No.  Do you have questions or concerns about your Care? No.  Actions: * If pain score is 4 or above: No action needed, pain <4.  Have you developed a fever since your procedure? no  2.   Have you had an respiratory symptoms (SOB or cough) since your procedure? no  3.   Have you tested positive for COVID 19 since your procedure no  4.   Have you had any family members/close contacts diagnosed with the COVID 19 since your procedure?  no   If yes to any of these questions please route to Joylene John, RN and Joella Prince, RN

## 2021-08-19 ENCOUNTER — Encounter: Payer: Self-pay | Admitting: Internal Medicine

## 2021-08-31 MED ORDER — SILDENAFIL 50 MG TAB
50 mg | ORAL_TABLET | ORAL | 0 refills | Status: AC
Start: 2021-08-31 — End: ?

## 2022-01-28 ENCOUNTER — Telehealth: Payer: Self-pay | Admitting: Neurology

## 2022-01-28 DIAGNOSIS — G4733 Obstructive sleep apnea (adult) (pediatric): Secondary | ICD-10-CM

## 2022-01-28 NOTE — Telephone Encounter (Signed)
Pt said [facility] requesting a prescriptions for CPAP supplies.

## 2022-01-28 NOTE — Telephone Encounter (Signed)
Sent  message to [facility] for new order supplies for pt.

## 2022-01-28 NOTE — Telephone Encounter (Signed)
Pt last seen 05/2021 , see back in a year.  Using [facility]ironics ([facility]).  Requesting order for cpap supplies.  Order placed/ Dr. Rexene Alberts to [facility].

## 2022-02-03 ENCOUNTER — Encounter: Payer: Self-pay | Admitting: *Deleted

## 2022-05-28 MED ORDER — LISINOPRIL 10 MG PO TABS
10 MG | ORAL_TABLET | Freq: Every day | ORAL | 1 refills | Status: AC
Start: 2022-05-28 — End: 2022-09-28

## 2022-06-30 ENCOUNTER — Encounter: Payer: Self-pay | Admitting: Neurology

## 2022-06-30 ENCOUNTER — Ambulatory Visit: Payer: Medicare Other | Admitting: Neurology

## 2022-06-30 VITALS — BP 135/79 | HR 72 | Ht 72.0 in | Wt 181.4 lb

## 2022-06-30 DIAGNOSIS — G4733 Obstructive sleep apnea (adult) (pediatric): Secondary | ICD-10-CM | POA: Diagnosis not present

## 2022-06-30 NOTE — Patient Instructions (Signed)
It was nice to see you again today.  Please continue with your AutoPap, you can try your oral appliance with the AutoPap for a few days and see if your events improve, I would be happy to review a download also remotely if need be.  As discussed, we will also increase the maximum AutoPap pressure to 13 cm at this time.  It may help to use humidifier even with cool mist added for better tolerance. Please follow-up routinely in 1 year to see the nurse practitioner.

## 2022-06-30 NOTE — Progress Notes (Signed)
Subjective:    Patient ID: Isaiah Greer is a 71 y.o. male.  HPI    Interim history:   Isaiah Greer is a 71 year old male with an underlying medical history of diverticulosis, colonic polyps, history of detached retina, hypertension, melanoma, low back pain and sciatica, who presents for follow-up consultation of his obstructive sleep apnea, for his yearly checkup.  The patient is unaccompanied today.  I first met him at the request of his primary care physician on 06/30/2021, at which time he reported a prior diagnosis of obstructive sleep apnea.  He had moved to [place] and was established on AutoPap therapy with excellent compliance and good apnea control.  He had recently received a replacement of his AutoPap machine from the [employer] recall.  Today, 06/30/2022: I reviewed his AutoPap compliance data from 05/30/2022 through 06/28/2022, which is a total of 30 days, during which time he used his machine 27 days with percent use days greater than 4 hours at 80%, indicating very good compliance with an average usage of 5 hours and 25 minutes, residual AHI elevated at 8.5, breakdown between obstructive and central events not available.  90th percentile pressure at 10.7 cm with a range of 5 to 12 cm.  Ramp time of 20 minutes with a starting pressure of 4 cm.  In the past 90 days his compliance was excellent, percent use days greater than 4 hours at 90%, average AHI 7.9/h, 90th percentile of pressure at 9.8 cm, low leak.  He has noticed erratic values for his events, he uses nasal pillows.  He is up-to-date with his supplies, cleans the nasal pillows every day and generally changes the pillows every 2 weeks or so.  He has changed the filter about every week.  He is wondering if he should trial his oral appliance again, has not used it, was not very successful on its own.  He is willing to increase the maximum pressure to 13 cm to see if his events go down a little bit.  Weight has been  stable, has started a new medication for dry eyes, and also uses the Flonase more consistently for allergies.  When he stays in [place] which is about 6 months out of the year, he generally does not use the humidifier feature of the machine but is willing to add some coolmist if need be.  He reports no new issues otherwise.  The patient's allergies, current medications, family history, past medical history, past social history, past surgical history and problem list were reviewed and updated as appropriate.   Previously:   06/30/21: (He) was previously diagnosed with obstructive sleep apnea and placed on CPAP therapy.  I reviewed office notes from 04/21/2021.  His Epworth sleepiness score is 9/24, fatigue severity score is 15/63.  He had sleep testing in [place], [place].  Study was performed on 11/20/2019.  His overall AHI was 17.2/h, O2 nadir was 82%.  He was on a dental device at the time.  I was able to review his compliance data from his AutoPap machine.  He received a new machine recently because of the [employer] Respironics PAP machine recall.  I reviewed his compliance data from 05/31/2021 through 06/29/2021, which is a total of 30 days, during which time he used his machine every night except for 2 nights, percent use days greater than 4 hours is 93.3%, indicating excellent compliance with an average usage of 6 hours and 13 minutes, residual AHI at goal at 4.2/h, 90th percentile of pressure  at 8 cm with a range of 5 to 12 cm.   Of note, he had a home sleep test on 03/30/2018 and I was able to review the results: Total AHI was 17/h, average oxygen saturation 97%, nadir was 76%.  He was placed on AutoPap therapy.  He then tried a dental device to a dentist friend of his but it was not as effective in treating his sleep apnea and so he went back to using his AutoPap.  His machine was recalled and he just got a new machine a week ago.  His DME company is [employer].   He is married and lives with his  wife.  He has a son who lives in [place] and daughter lives in [place], [place].  He continues to [place] after his retirement.  He is a non-smoker, does not utilize alcohol or caffeine.  Bedtime is generally between 10 and 11 PM and rise time between 6 and 7 AM.  He denies recurrent morning headaches.  He has nocturia about once per average night.  He benefits from treating his sleep apnea with his AutoPap although he would like to consider going back to a dental device at some point if possible.  For now, he is content using his AutoPap, uses extra small nasal pillows.  He is up-to-date with his supplies.  Benefit includes sleep consolidation and good sleep quality, less daytime somnolence from treating sleep apnea with an AutoPap.  Weight has been more or less stable.  He had a tonsillectomy and adenoidectomy in elementary school.  He is new dentist is [name], she also provides treatment for sleep apnea with a dental device as I understand.   On 08/15/2021, Addendum: I have received records from [facility].  Patient's had an office visit on 12/10/2020 with the sleep clinic.  He was seen by [name] and was compliant with his CPAP machine.  Recall of his Philips machine was discussed.  He endorsed benefit from treatment and was advised to follow-up in 1 year.  He had an office visit with [name], [place] on 03/05/2020, at which time he was advised to continue with CPAP, follow-up in 9 months, recall notification from June 2021 was discussed. He had an office visit with the PA on 11/28/2019, at which time he was advised to continue with CPAP and follow-up in 3 months.  He had an appointment with [name] on 08/11/2019, at which time patient was advised to continue with CPAP and follow-up in 3 months, his HST results with an AHI of 17/h were discussed. He had an appointment with [name] on 02/09/2019, at which time his compliance with his machine was good and AHI  well-controlled.  He was advised to follow-up in 6 months. He had an office visit with [name] on 07/20/2018, at which time his AHI was mildly elevated and he was advised to increase his AutoPap to 5 to 12 cm.  He was advised to follow-up in 6 months. He had an office visit with [name] on 05/13/2018, at which time he had a mask fit appointment and was started on AutoPap of 5 to 10 cm.  His Past Medical History Is Significant For: Past Medical History:  Diagnosis Date   Colon polyps    Detached retina    Diverticulosis    Dry eyes    Hypertension    Melanoma ([place])    L upper arm   OSA on  CPAP    Sciatica of right side    L sised SI joint pain   Sleep apnea     His Past Surgical History Is Significant For: Past Surgical History:  Procedure Laterality Date   COLONOSCOPY  08/14/2021   Dr. Susy Manor CYST EXCISION     R hand 05-31-2010   [facility]   MELANOMA EXCISION  2019   RETINAL DETACHMENT REPAIR W/ SCLERAL BUCKLE LE  2008   RETINAL TEAR REPAIR CRYOTHERAPY  11/29/1993   TONSILLECTOMY  1959    His Family History Is Significant For: Family History  Problem Relation Age of Onset   Heart disease Mother    Heart failure Mother    Prostate cancer Father    Hypertension Father    Prostate cancer Brother    Colon cancer Neg Hx    Stomach cancer Neg Hx    Esophageal cancer Neg Hx    Rectal cancer Neg Hx     His Social History Is Significant For: Social History   Socioeconomic History   Marital status: Married    Spouse name: Not on file   Number of children: Not on file   Years of education: Not on file   Highest education level: Not on file  Occupational History   Not on file  Tobacco Use   Smoking status: Never   Smokeless tobacco: Never  Vaping Use   Vaping Use: Never used  Substance and Sexual Activity   Alcohol use: Never   Drug use: Never   Sexual activity: Not on file  Other Topics Concern   Not on file  Social History Narrative    Caffeine none. 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  Consult.     Social Determinants of Health   Financial Resource Strain: Not on file  Food Insecurity: Not on file  Transportation Needs: Not on file  Physical Activity: Not on file  Stress: Not on file  Social Connections: Not on file    His Allergies Are:  No Known Allergies:   His Current Medications Are:  Outpatient Encounter Medications as of 06/30/2022  Medication Sig   azelastine (ASTELIN) 0.1 % nasal spray Place 2 sprays into both nostrils daily. Use in each nostril as directed   fexofenadine (ALLEGRA) 180 MG tablet Take 180 mg by mouth daily.   fluticasone (FLONASE) 50 MCG/ACT nasal spray Place 1 spray into both nostrils daily.   lisinopril (ZESTRIL) 10 MG tablet Take 10 mg by mouth daily.   Omega-3 Fatty Acids (FISH OIL) 1000 MG CAPS Take by mouth daily.   sildenafil (VIAGRA) 50 MG tablet Take 50 mg by mouth daily as needed for erectile dysfunction.   triamcinolone cream (KENALOG) 0.1 % Apply 1 application topically as needed.   XIIDRA 5 % SOLN Apply 1 drop to eye 2 (two) times daily.   No facility-administered encounter medications on file as of 06/30/2022.  :  Review of Systems:  Out of a complete 14 point review of systems, all are reviewed and negative with the exception of these symptoms as listed below:   Review of Systems  Neurological:        Followup cpap.  Using respironic phillips machine (got last year).  Reports for 30 and 90 days.  Asking about replacing nasal tubing every 2 wks. ESS 11  Also has dental OSA appliance that he go from dentist.      Objective:  Neurological Exam  Physical Exam Physical  Examination:   Vitals:   06/30/22 1035  BP: 135/79  Pulse: 72    General Examination: The patient is a very pleasant 71 y.o. male in no acute distress. He appears well-developed and well-nourished and well groomed.   HEENT: Normocephalic, atraumatic, pupils are equal, round and reactive  to light, corrective eyeglasses in place. Hearing grossly intact.  Face is symmetric with normal facial animation. Speech is clear with no dysarthria noted. There is no hypophonia. There is no lip, neck/head, jaw or voice tremor. Neck with FROM. There are no carotid bruits on auscultation. Oropharynx exam reveals: no significant mouth dryness, good dental hygiene, mild airway crowding.  Tongue protrudes centrally and palate elevates symmetrically.    Chest: Clear to auscultation without wheezing, rhonchi or crackles noted.   Heart: S1+S2+0, regular and normal without murmurs, rubs or gallops noted.    Abdomen: Soft, non-tender and non-distended.   Extremities: There is no pitting edema in the distal lower extremities bilaterally.    Skin: Warm and dry without trophic changes noted.    Musculoskeletal: exam reveals no obvious joint deformities.    Neurologically:  Mental status: The patient is awake, alert and oriented in all 4 spheres. His immediate and remote memory, attention, language skills and fund of knowledge are appropriate. There is no evidence of aphasia, agnosia, apraxia or anomia. Speech is clear with normal prosody and enunciation. Thought process is linear. Mood is normal and affect is normal.  Cranial nerves II - XII are as described above under HEENT exam.  Motor exam: Normal bulk, strength and tone is noted. There is no obvious tremor, fine motor skills and coordination: grossly intact.  Cerebellar testing: No dysmetria or intention tremor. There is no truncal or gait ataxia.  Sensory exam: intact to light touch in the upper and lower extremities.  Gait, station and balance: He stands easily. No veering to one side is noted. No leaning to one side is noted. Posture is age-appropriate and stance is narrow based. Gait shows normal stride length and normal pace. No problems turning are noted.    Assessment and Plan:    In summary, Kyl Givler is a very pleasant 71 year old  male with an underlying medical history of diverticulosis, colonic polyps, history of detached retina, hypertension, melanoma, low back pain and sciatica, who presents for follow-up consultation of his obstructive sleep apnea, on AutoPap therapy.  He is compliant with his AutoPap machine, his residual events have increased a little bit.  He received a replacement machine from the [employer]ics recall in 2022.  He is compliant with treatment, he is advised to trial his AutoPap with the dental device for a few days and see if his events go down.  We will also increase the maximum AutoPap pressure to 13 cm at this time, he is up-to-date with supplies and commended for his treatment adherence.  He previously had a home sleep test in 2019.  He had another home sleep test while he was using his dental device in 2021 but the device was not as effective as AutoPap therapy. He continues to benefit from treatment.  He is advised to follow-up in this clinic in 1 year to see one of our nurse practitioners.  We can offer a virtual visit through MyChart if need be.  I answered all his questions today and he was in agreement.   I spent 20 minutes in total face-to-face time and in reviewing records during pre-charting, more than 50% of which was spent  in counseling and coordination of care, reviewing test results, reviewing medications and treatment regimen and/or in discussing or reviewing the diagnosis of OSA, the prognosis and treatment options. Pertinent laboratory and imaging test results that were available during this visit with the patient were reviewed by me and considered in my medical decision making (see chart for details).

## 2022-07-01 NOTE — Progress Notes (Signed)
New, Willodean Rosenthal, RN; Redmond Pulling, Jake Shark Received, Thank you!      Previous Messages    ----- Message -----  From: Brandon Melnick, RN  Sent: 06/30/2022  12:03 PM EDT  To: Darlina Guys; Miquel Dunn; Nash Shearer; *  Subject: increase pressure                               Pt in respironics.  I have changed pressure mx to 13 in system. Wanted to let you know as well.     Kwesi Sangha  Male, 71 y.o., [birth year]  MRN:  [id number]    [facility]

## 2022-09-28 MED ORDER — LISINOPRIL 10 MG PO TABS
10 MG | ORAL_TABLET | Freq: Every day | ORAL | 3 refills | Status: AC
Start: 2022-09-28 — End: ?

## 2022-11-21 IMAGING — CR DG LUMBAR SPINE 2-3V
3 series · 3 of 3 positions shown · non-contrast
Comparison: None.

CLINICAL DATA: Low back pain

EXAM:
LUMBAR SPINE - 2-3 VIEW

[t l-spine a.p.]
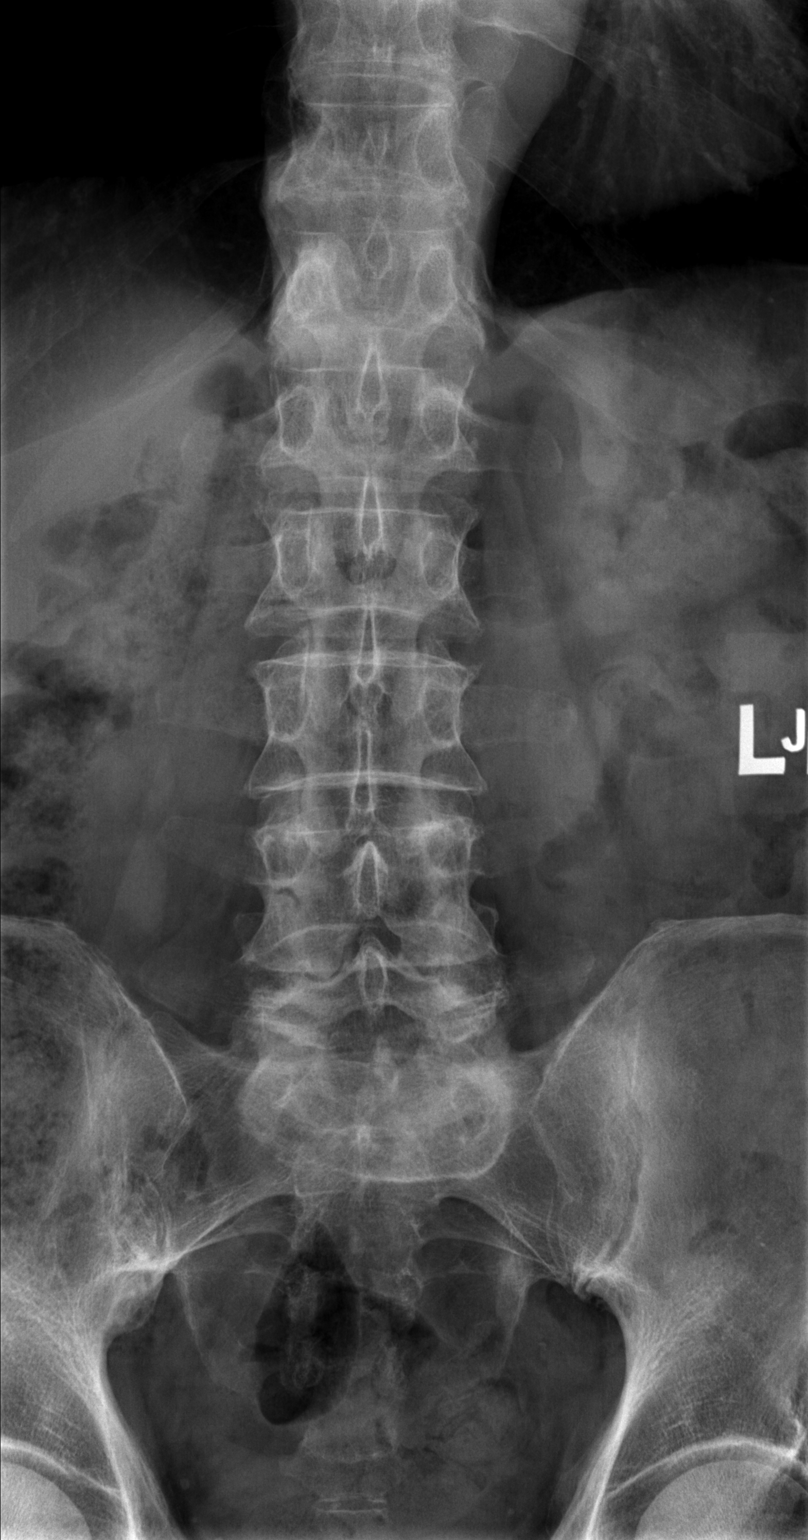

[t l-spine lat]
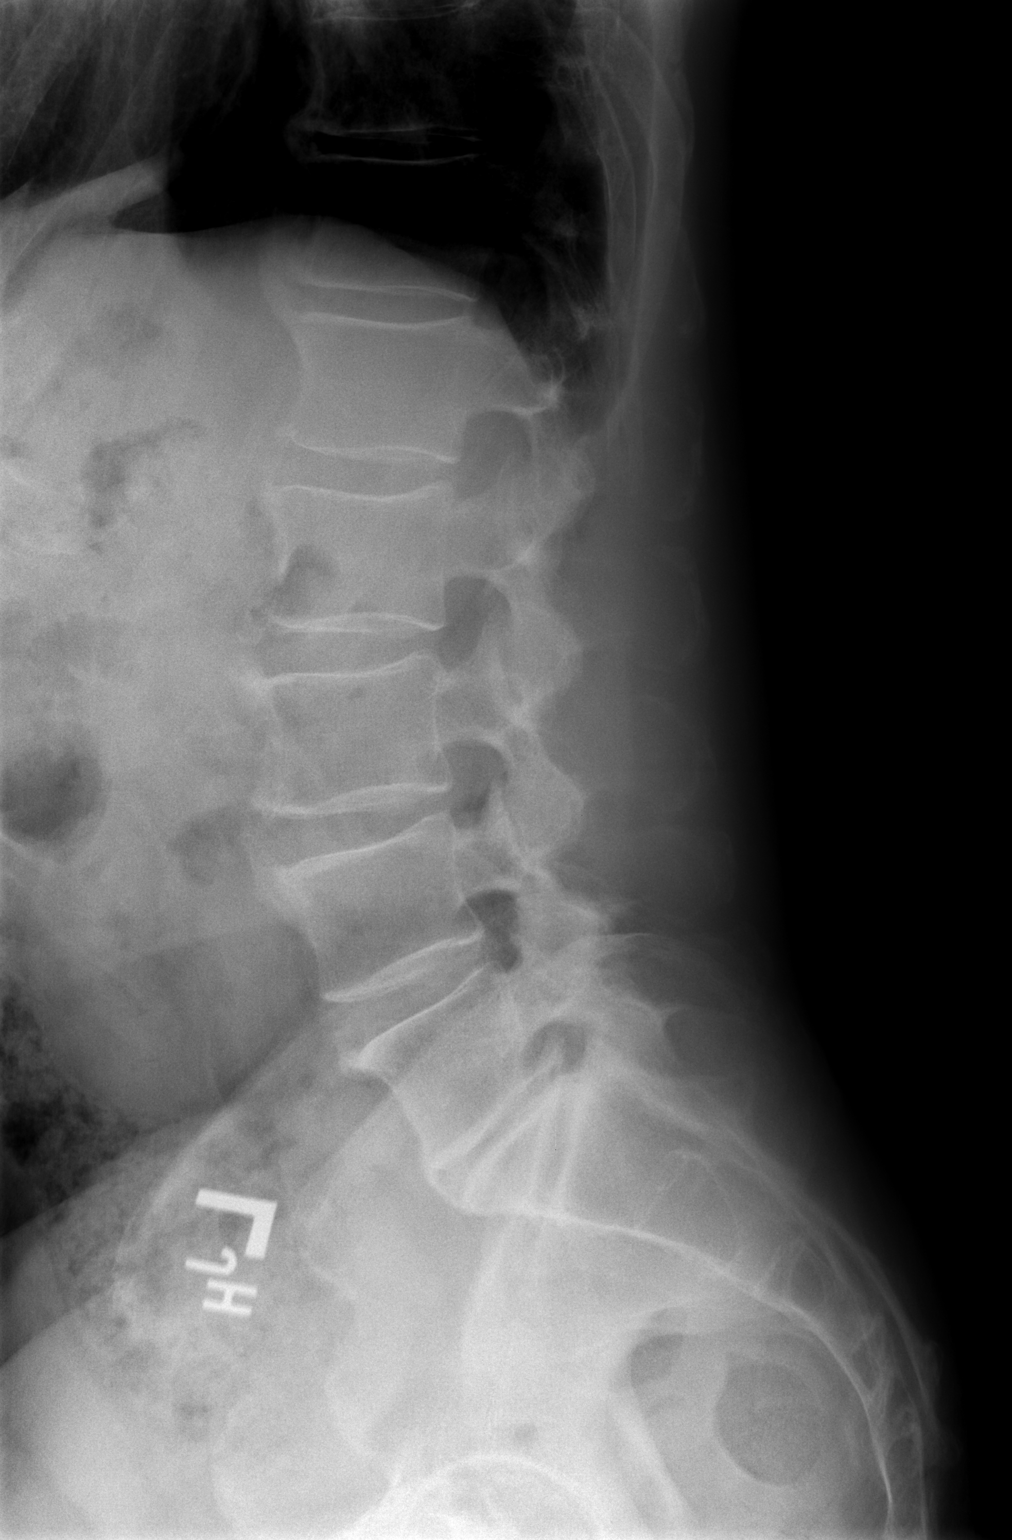

[t l-spine l5-s1 spot]
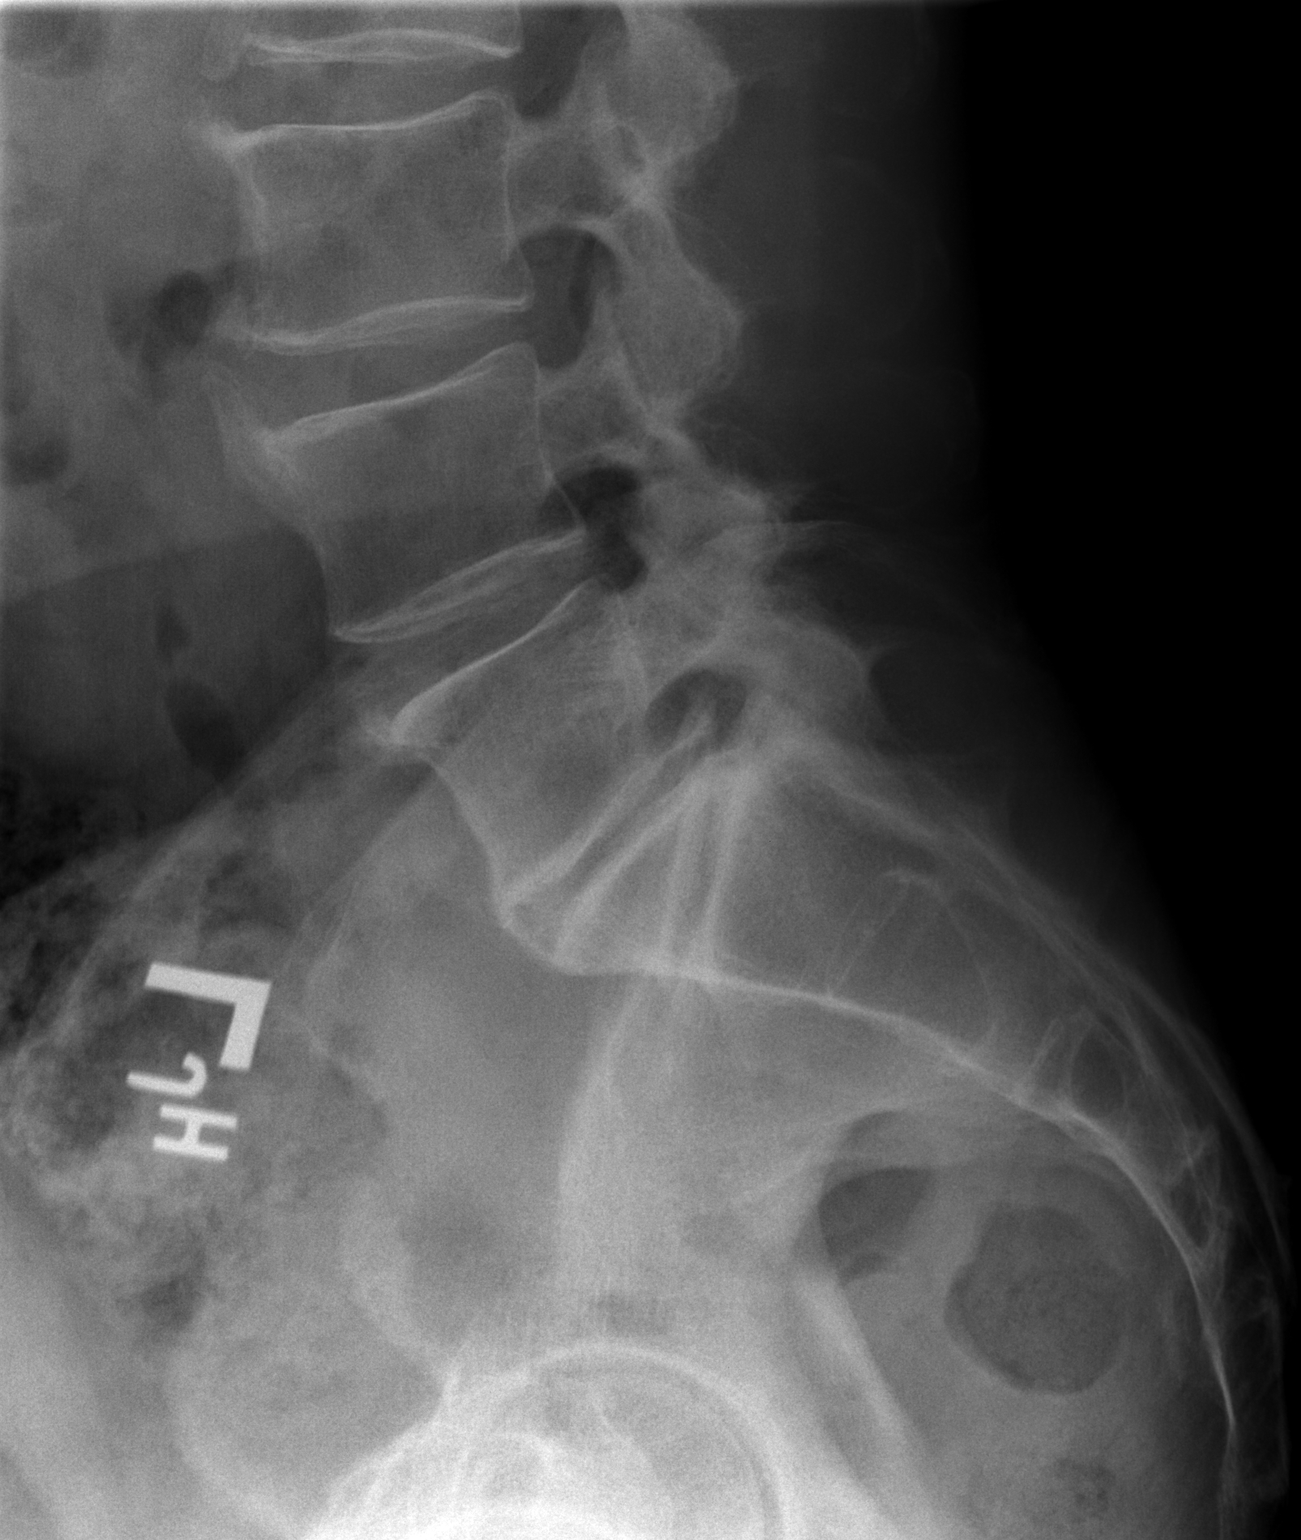

[3 of 3 positions shown; findings below may reference images not displayed]

FINDINGS: Degenerative spurring anteriorly throughout the lumbar spine. Disc
space narrowing at L5-S1. Mild degenerative facet disease in the
lower lumbar spine. Normal alignment. No fracture. SI joints
symmetric and unremarkable.
IMPRESSION: Degenerative disc and facet disease as above. No acute bony
abnormality.

## 2023-06-30 NOTE — Progress Notes (Unsigned)
[facility] [address] [place]. [place] [address] [phone]       OFFICE FOLLOW UP NOTE  Mr. Isaiah Greer Date of Birth:  04/26/1951 Medical Record Number:  [id number]   Reason for visit: Initial CPAP follow-up    SUBJECTIVE:   CHIEF COMPLAINT:  No chief complaint on file.   Follow-up visit:  Prior visit:  Brief HPI:   Isaiah Greer is a 72 y.o. male who was evaluated by Dr. Frances Furbish on 06/30/2021 for treatment of known sleep apnea on CPAP.  Initial sleep study performed in [place] in 10/2019, placed on AutoPap therapy and then tried dental device but eventually returned back to AutoPap.       Interval history:        ROS:   14 system review of systems performed and negative with exception of those listed in HPI  PMH:  Past Medical History:  Diagnosis Date   Colon polyps    Detached retina    Diverticulosis    Dry eyes    Hypertension    Melanoma (HCC)    L upper arm   OSA on CPAP    Sciatica of right side    L sised SI joint pain   Sleep apnea     PSH:  Past Surgical History:  Procedure Laterality Date   COLONOSCOPY  08/14/2021   Dr. Jacqulyn Ducking CYST EXCISION     R hand 05-31-2010   HERNIA REPAIR  1963   MELANOMA EXCISION  2019   RETINAL DETACHMENT REPAIR W/ SCLERAL BUCKLE LE  2008   RETINAL TEAR REPAIR CRYOTHERAPY  11/29/1993   TONSILLECTOMY  1959    Social History:  Social History   Socioeconomic History   Marital status: Married    Spouse name: Not on file   Number of children: Not on file   Years of education: Not on file   Highest education level: Not on file  Occupational History   Not on file  Tobacco Use   Smoking status: Never   Smokeless tobacco: Never  Vaping Use   Vaping status: Never Used  Substance and Sexual Activity   Alcohol use: Never   Drug use: Never   Sexual activity: Not on file  Other Topics Concern   Not on file  Social History Narrative   Caffeine none. Education  : Phd in [employer], Work: retired.  Consult.     Social Determinants of Health   Financial Resource Strain: Not on file  Food Insecurity: Not on file  Transportation Needs: Not on file  Physical Activity: Not on file  Stress: Not on file  Social Connections: Not on file  Intimate Partner Violence: Not on file    Family History:  Family History  Problem Relation Age of Onset   Heart disease Mother    Heart failure Mother    Prostate cancer Father    Hypertension Father    Prostate cancer Brother    Colon cancer Neg Hx    Stomach cancer Neg Hx    Esophageal cancer Neg Hx    Rectal cancer Neg Hx     Medications:   Current Outpatient Medications on File Prior to Visit  Medication Sig Dispense Refill   azelastine (ASTELIN) 0.1 % nasal spray Place 2 sprays into both nostrils daily. Use in each nostril as directed     fexofenadine (ALLEGRA) 180 MG tablet Take 180 mg by mouth daily.     fluticasone (FLONASE) 50 MCG/ACT nasal  spray Place 1 spray into both nostrils daily.     lisinopril (ZESTRIL) 10 MG tablet Take 10 mg by mouth daily.     Omega-3 Fatty Acids (FISH OIL) 1000 MG CAPS Take by mouth daily.     sildenafil (VIAGRA) 50 MG tablet Take 50 mg by mouth daily as needed for erectile dysfunction.     triamcinolone cream (KENALOG) 0.1 % Apply 1 application topically as needed.     XIIDRA 5 % SOLN Apply 1 drop to eye 2 (two) times daily.     No current facility-administered medications on file prior to visit.    Allergies:  No Known Allergies    OBJECTIVE:  Physical Exam  There were no vitals filed for this visit. There is no height or weight on file to calculate BMI. No results found.   General: well developed, well nourished, seated, in no evident distress Head: head normocephalic and atraumatic.   Neck: supple with no carotid or supraclavicular bruits Cardiovascular: regular rate and rhythm, no murmurs Musculoskeletal: no deformity Skin:  no  rash/petichiae Vascular:  Normal pulses all extremities   Neurologic Exam Mental Status: Awake and fully alert. Oriented to place and time. Recent and remote memory intact. Attention span, concentration and fund of knowledge appropriate. Mood and affect appropriate.  Cranial Nerves: Pupils equal, briskly reactive to light. Extraocular movements full without nystagmus. Visual fields full to confrontation. Hearing intact. Facial sensation intact. Face, tongue, palate moves normally and symmetrically.  Motor: Normal bulk and tone. Normal strength in all tested extremity muscles Sensory.: intact to touch , pinprick , position and vibratory sensation.  Coordination: Rapid alternating movements normal in all extremities. Finger-to-nose and heel-to-shin performed accurately bilaterally. Gait and Station: Arises from chair without difficulty. Stance is normal. Gait demonstrates normal stride length and balance without use of AD. Tandem walk and heel toe without difficulty.  Reflexes: 1+ and symmetric. Toes downgoing.         ASSESSMENT/PLAN: Isaiah Greer is a 72 y.o. year old male    OSA on CPAP : Compliance report shows satisfactory usage with optimal residual AHI.  Discussed continued nightly usage with ensuring greater than 4 hours nightly for optimal benefit and per insurance purposes.  Continue to follow with DME company for any needed supplies or CPAP related concerns     Follow up in *** or call earlier if needed   CC:  PCP: Alysia Penna, MD    I spent *** minutes of face-to-face and non-face-to-face time with patient.  This included previsit chart review, lab review, study review, order entry, electronic health record documentation, patient education and discussion regarding above diagnoses and treatment plan and answered all other questions to patient's satisfaction    Ihor Austin, [facility]  [facility] [address] [place], [place]  [address]  Phone [phone] Fax [phone] Note: This document was prepared with digital dictation and possible smart phrase technology. Any transcriptional errors that result from this process are unintentional.

## 2023-07-01 ENCOUNTER — Encounter: Payer: Self-pay | Admitting: Adult Health

## 2023-07-01 ENCOUNTER — Ambulatory Visit: Payer: Medicare Other | Admitting: Adult Health

## 2023-07-01 VITALS — BP 122/78 | HR 63 | Ht 72.0 in | Wt 182.0 lb

## 2023-07-01 DIAGNOSIS — G4733 Obstructive sleep apnea (adult) (pediatric): Secondary | ICD-10-CM | POA: Diagnosis not present

## 2023-07-01 NOTE — Patient Instructions (Signed)
Your Plan:  Continue nightly use of CPAP for sleep apnea management  Continue to follow with your DME for any needed supplies or CPAP related concerns   Try Melatonin 2-5mg  nightly - can gradually increase up to 10mg  if needed to help with sleep. Take this medication at sunset.      Follow up in 1 year or call earlier if needed      Thank you for coming to see [place] at [facility]. I hope we have been able to provide you high quality care today.  You may receive a patient satisfaction survey over the next few weeks. We would appreciate your feedback and comments so that we may continue to improve ourselves and the health of our patients.

## 2024-03-20 ENCOUNTER — Telehealth: Payer: Self-pay | Admitting: Adult Health

## 2024-03-20 NOTE — Telephone Encounter (Signed)
 PT called to request to speak to  Md or nurse the patient states that [employer] [facility] informed Pt need an face to face appointment with MD to get new Cpap Machine. Pt states that since he already have appt does he have to wait till tha appointment or could doctor send new order over the patient is not sure what to do .

## 2024-03-20 NOTE — Telephone Encounter (Signed)
 Called the patient back. I can get him worked in sooner. I was able to schedule a visit for aug. Made the patient aware that prior to the Video visit he will need to bring the machine in to do a updated download so that we can document that information in the appt. Her was appreciative.

## 2024-04-10 ENCOUNTER — Telehealth: Payer: Self-pay | Admitting: Adult Health

## 2024-04-10 NOTE — Progress Notes (Signed)
 [facility] [address] [place]. [place] [address] [phone]       OFFICE FOLLOW UP NOTE  Mr. Rueben Kassim Date of Birth:  1951-05-12 Medical Record Number:  [id number]    Primary neurologist: Dr. Buck Reason for visit: CPAP follow-up    SUBJECTIVE:   CHIEF COMPLAINT:  Chief Complaint  Patient presents with   Follow-up    Patient in room 8, self, Sleep Apnea, would like to have a new CPAP machine, as the old one is broken. Currently using a Mudlogger from [employer]     Follow-up visit:  Prior visit: 07/01/2023  Brief HPI:   Nicky Kras is a 73 y.o. male who was evaluated by Dr. Buck on 06/30/2021 for treatment of known sleep apnea on CPAP after moving to [place].  Initial sleep study performed in [place]  in 10/2017, placed on AutoPap therapy and then tried dental device but eventually returned back to AutoPap. Machine recalled in 2022 and  received new replacement in 05/2021. Settings adjusted in 05/2022 from 5-12 to 5-13 due to elevated residual AHI.   At prior visit, compliance report showed good compliance with residual AHI 4.0. continued daytime fatigue, ESS 12/24.     Interval history:  Patient returns for follow-up visit.  He received a new machine in 11/2023 as his prior [employer] machine was malfunctioning (dehumidifier), he is currently using a loaner ResMed S11 machine through [employer]. Prior to visit, contacted [employer] [facility] who said he would be using loaner machine until he was eligible for new machine in 2027 but patient reports he was told he needs a new prescription today to obtain a new machine. He likes his current machine and does have any questions or concerns today. Does report illness over the past couple of weeks causing increased congestion making use of CPAP difficult.  Compliance report over the past 90 days shows excellent usage and optimal residual AHI.  ESS 7/24.  No questions or concerns at this  time.            ROS:   14 system review of systems performed and negative with exception of those listed in HPI  PMH:  Past Medical History:  Diagnosis Date   Colon polyps    Detached retina    Diverticulosis    Dry eyes    Hypertension    Melanoma (HCC)    L upper arm   OSA on CPAP    Sciatica of right side    L sised SI joint pain   Sleep apnea     PSH:  Past Surgical History:  Procedure Laterality Date   COLONOSCOPY  08/14/2021   Dr. Federico GULA CYST EXCISION     R hand 05-31-2010   HERNIA REPAIR  1963   MELANOMA EXCISION  2019   RETINAL DETACHMENT REPAIR W/ SCLERAL BUCKLE LE  2008   RETINAL TEAR REPAIR CRYOTHERAPY  11/29/1993   TONSILLECTOMY  1959    Social History:  Social History   Socioeconomic History   Marital status: Married    Spouse name: Not on file   Number of children: Not on file   Years of education: Not on file   Highest education level: Not on file  Occupational History   Not on file  Tobacco Use   Smoking status: Never   Smokeless tobacco: Never  Vaping Use   Vaping status: Never Used  Substance and Sexual Activity   Alcohol use: Never  Drug use: Never   Sexual activity: Not on file  Other Topics Concern   Not on file  Social History Narrative   Caffeine none. 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  Consult.     Social Drivers of Corporate investment banker Strain: Not on file  Food Insecurity: Not on file  Transportation Needs: Not on file  Physical Activity: Not on file  Stress: Not on file  Social Connections: Not on file  Intimate Partner Violence: Not on file    Family History:  Family History  Problem Relation Age of Onset   Heart disease Mother    Heart failure Mother    Prostate cancer Father    Hypertension Father    Prostate cancer Brother    Colon cancer Neg Hx    Stomach cancer Neg Hx    Esophageal cancer Neg Hx    Rectal cancer Neg Hx     Medications:   Current Outpatient  Medications on File Prior to Visit  Medication Sig Dispense Refill   azelastine (ASTELIN) 0.1 % nasal spray Place 2 sprays into both nostrils daily. Use in each nostril as directed     Cetirizine HCl (ZYRTEC ALLERGY PO) Take 1 tablet by mouth daily.     fluticasone (FLONASE) 50 MCG/ACT nasal spray Place 1 spray into both nostrils daily.     lisinopril (ZESTRIL) 10 MG tablet Take 10 mg by mouth daily.     Omega-3 Fatty Acids (FISH OIL) 1000 MG CAPS Take by mouth daily.     sildenafil (VIAGRA) 50 MG tablet Take 50 mg by mouth daily as needed for erectile dysfunction.     triamcinolone cream (KENALOG) 0.1 % Apply 1 application topically as needed.     XIIDRA 5 % SOLN Apply 1 drop to eye 2 (two) times daily.     fexofenadine (ALLEGRA) 180 MG tablet Take 180 mg by mouth daily. (Patient not taking: Reported on 04/11/2024)     No current facility-administered medications on file prior to visit.    Allergies:  No Known Allergies    OBJECTIVE:  Physical Exam  Vitals:   04/11/24 1458  BP: 127/75  Pulse: 65  Weight: 182 lb (82.6 kg)   Body mass index is 24.68 kg/m. No results found.  General: well developed, well nourished, very pleasant elderly Caucasian male, seated, in no evident distress  Neurologic Exam Mental Status: Awake and fully alert. Oriented to place and time. Recent and remote memory intact. Attention span, concentration and fund of knowledge appropriate. Mood and affect appropriate.  Cranial Nerves: Pupils equal, briskly reactive to light. Extraocular movements full without nystagmus. Visual fields full to confrontation. Hearing intact. Facial sensation intact. Face, tongue, palate moves normally and symmetrically.  Motor: Normal bulk and tone. Normal strength in all tested extremity muscles Gait and Station: Arises from chair without difficulty. Stance is normal. Gait demonstrates normal stride length and balance without use of AD. Tandem walk and heel toe without  difficulty.  Reflexes: 1+ and symmetric. Toes downgoing.         ASSESSMENT/PLAN: Deontrae Drinkard is a 73 y.o. year old male    OSA on CPAP :  Compliance report shows satisfactory usage with optimal residual AHI.   Continue current pressure settings of 5-13 with EPR 1 Currently using loaner ResMed machine through DME. Confusion regarding continued use of loaner machine until eligible for new machine in 2027 or if he is able to obtain new machine now with updated  prescription. Will place an order to obtain new CPAP machine, patient advised insurance may not cover and may have to keep loaner machine until he is eligible Discussed continued nightly usage with ensuring greater than 4 hours nightly for optimal benefit and per insurance purposes.   Continue to follow with DME company for any needed supplies or CPAP related concerns CPAP [place] set up 2019, machine recalled and received new machine in 2022, phillips machine malfunctioned and received loaner ResMed machine 11/2023     Follow up in 1 year via MyChart video visit or call earlier if needed   CC:  PCP: Larnell Hamilton, MD    I personally spent a total of 25 minutes in the care of the patient today including preparing to see the patient, performing a medically appropriate exam/evaluation, counseling and educating, placing orders, and documenting clinical information in the EHR.   Harlene Bogaert, AGNP-BC  [facility] [address] [place], [place] [address]  Phone [phone] Fax [phone] Note: This document was prepared with digital dictation and possible smart phrase technology. Any transcriptional errors that result from this process are unintentional.

## 2024-04-11 ENCOUNTER — Encounter: Payer: Self-pay | Admitting: Adult Health

## 2024-04-11 ENCOUNTER — Ambulatory Visit: Admitting: Adult Health

## 2024-04-11 VITALS — BP 127/75 | HR 65 | Wt 182.0 lb

## 2024-04-11 DIAGNOSIS — G4733 Obstructive sleep apnea (adult) (pediatric): Secondary | ICD-10-CM | POA: Diagnosis not present

## 2024-04-11 NOTE — Patient Instructions (Addendum)
 Your Plan:  Continue nightly use of CPAP for adequate sleep apnea management   Will follow up with [employer] [facility] regarding continued use of loaner machine versus obtaining new machine  Continue to follow with DME for any needs supplies or CPAP related concerns      Follow up in 1 year or call earlier if needed       Thank you for coming to see us  at [facility]. I hope we have been able to provide you high quality care today.  You may receive a patient satisfaction survey over the next few weeks. We would appreciate your feedback and comments so that we may continue to improve ourselves and the health of our patients.

## 2024-04-12 NOTE — Progress Notes (Signed)
 Carzell Saldivar D, CMA  Joylene Bradley; Garcia, Patricia; Ziegler, Melissa; Tucker, Dolanda; Cain, [place] New orders have been placed for the above pt, DOB: [birth year] Thanks

## 2024-06-08 ENCOUNTER — Other Ambulatory Visit: Payer: Self-pay | Admitting: Urology

## 2024-06-08 DIAGNOSIS — R972 Elevated prostate specific antigen [PSA]: Secondary | ICD-10-CM

## 2024-06-29 ENCOUNTER — Telehealth: Payer: Medicare Other | Admitting: Adult Health

## 2025-04-12 ENCOUNTER — Telehealth: Admitting: Adult Health
